# Patient Record
Sex: Female | Born: 1950
Health system: Southern US, Community
[De-identification: ages and names within clinical notes are randomized; demographics above are authoritative.]

## PROBLEM LIST (undated history)

## (undated) DIAGNOSIS — E785 Hyperlipidemia, unspecified: Secondary | ICD-10-CM

## (undated) DIAGNOSIS — E079 Disorder of thyroid, unspecified: Secondary | ICD-10-CM

## (undated) DIAGNOSIS — G43909 Migraine, unspecified, not intractable, without status migrainosus: Secondary | ICD-10-CM

## (undated) DIAGNOSIS — E039 Hypothyroidism, unspecified: Secondary | ICD-10-CM

## (undated) DIAGNOSIS — K219 Gastro-esophageal reflux disease without esophagitis: Secondary | ICD-10-CM

## (undated) HISTORY — PX: SPINAL FUSION: SHX223

## (undated) HISTORY — DX: Disorder of thyroid, unspecified: E07.9

## (undated) HISTORY — PX: ABDOMINAL HYSTERECTOMY: SHX81

## (undated) HISTORY — DX: Migraine, unspecified, not intractable, without status migrainosus: G43.909

## (undated) HISTORY — DX: Hyperlipidemia, unspecified: E78.5

## (undated) HISTORY — PX: FRACTURE SURGERY: SHX138

## (undated) HISTORY — DX: Gastro-esophageal reflux disease without esophagitis: K21.9

---

## 2005-07-07 ENCOUNTER — Ambulatory Visit: Payer: Self-pay | Admitting: Unknown Physician Specialty

## 2005-07-19 ENCOUNTER — Ambulatory Visit: Payer: Self-pay | Admitting: Internal Medicine

## 2005-08-30 ENCOUNTER — Ambulatory Visit: Payer: Self-pay | Admitting: Internal Medicine

## 2005-09-06 ENCOUNTER — Ambulatory Visit: Payer: Self-pay | Admitting: Internal Medicine

## 2006-10-26 ENCOUNTER — Ambulatory Visit: Payer: Self-pay | Admitting: Internal Medicine

## 2007-11-12 ENCOUNTER — Ambulatory Visit: Payer: Self-pay | Admitting: Internal Medicine

## 2009-03-31 ENCOUNTER — Ambulatory Visit: Payer: Self-pay | Admitting: Internal Medicine

## 2010-09-23 ENCOUNTER — Ambulatory Visit: Payer: Self-pay | Admitting: Internal Medicine

## 2011-05-26 ENCOUNTER — Ambulatory Visit: Payer: Self-pay | Admitting: Internal Medicine

## 2011-06-02 ENCOUNTER — Ambulatory Visit: Payer: Self-pay

## 2011-07-28 ENCOUNTER — Ambulatory Visit: Payer: Self-pay | Admitting: Unknown Physician Specialty

## 2011-08-01 LAB — PATHOLOGY REPORT

## 2011-09-29 ENCOUNTER — Ambulatory Visit: Payer: Self-pay | Admitting: Internal Medicine

## 2012-03-07 ENCOUNTER — Ambulatory Visit: Payer: Self-pay | Admitting: Physical Medicine and Rehabilitation

## 2012-07-10 ENCOUNTER — Encounter: Payer: Self-pay | Admitting: Orthopedic Surgery

## 2012-08-06 ENCOUNTER — Encounter: Payer: Self-pay | Admitting: Orthopedic Surgery

## 2012-09-30 ENCOUNTER — Ambulatory Visit: Payer: Self-pay | Admitting: Internal Medicine

## 2013-10-01 ENCOUNTER — Ambulatory Visit: Payer: Self-pay | Admitting: Internal Medicine

## 2014-08-13 ENCOUNTER — Ambulatory Visit: Payer: Self-pay | Admitting: Unknown Physician Specialty

## 2014-08-13 LAB — CLOSTRIDIUM DIFFICILE(ARMC)

## 2014-08-13 LAB — SEDIMENTATION RATE: ERYTHROCYTE SED RATE: 20 mm/h (ref 0–30)

## 2014-08-15 LAB — STOOL CULTURE

## 2014-08-15 LAB — WBCS, STOOL

## 2014-09-03 DIAGNOSIS — K529 Noninfective gastroenteritis and colitis, unspecified: Secondary | ICD-10-CM | POA: Insufficient documentation

## 2014-09-03 DIAGNOSIS — R1013 Epigastric pain: Secondary | ICD-10-CM | POA: Insufficient documentation

## 2014-09-03 DIAGNOSIS — K625 Hemorrhage of anus and rectum: Secondary | ICD-10-CM | POA: Insufficient documentation

## 2014-11-20 ENCOUNTER — Ambulatory Visit: Payer: Self-pay | Admitting: Internal Medicine

## 2015-06-29 ENCOUNTER — Ambulatory Visit (INDEPENDENT_AMBULATORY_CARE_PROVIDER_SITE_OTHER): Payer: 59 | Admitting: Podiatry

## 2015-06-29 ENCOUNTER — Encounter: Payer: Self-pay | Admitting: Podiatry

## 2015-06-29 ENCOUNTER — Ambulatory Visit: Payer: Self-pay

## 2015-06-29 ENCOUNTER — Ambulatory Visit: Payer: 59 | Admitting: Podiatry

## 2015-06-29 VITALS — BP 125/70 | HR 65 | Resp 17

## 2015-06-29 DIAGNOSIS — T148XXA Other injury of unspecified body region, initial encounter: Secondary | ICD-10-CM

## 2015-06-29 DIAGNOSIS — M779 Enthesopathy, unspecified: Secondary | ICD-10-CM | POA: Diagnosis not present

## 2015-06-29 DIAGNOSIS — T148 Other injury of unspecified body region: Secondary | ICD-10-CM

## 2015-06-29 DIAGNOSIS — M79671 Pain in right foot: Secondary | ICD-10-CM

## 2015-07-01 NOTE — Progress Notes (Signed)
Subjective:     Patient ID: Linda Rose, female   DOB: 1951/03/06, 64 y.o.   MRN: [id number]  HPI 64 year old female presents the office today for concerns of painful outside aspect of her right foot which has been ongoing for several months. She appears he had an x-ray performed and June for which she brings with her today. She does not want any further x-rays at this time. She says the pain is mostly outside portion of her foot mostly with walking or standing for prolonged periods of time. She has no pain at rest. She does get some intermittent swelling to outside aspect of her foot as well as an occasional burning pain. She's been the anti-inflammatories which do not seem to help she appears his been on a Medrol Dosepak which did not help either. She does have a history of a tibia fibular fracture in the early 1990s which resulted in deformity and she had an external fixator applied to help correct the deformity. No other complaints this time.  Review of Systems  All other systems reviewed and are negative.      Objective:   Physical Exam AAO x3, NAD DP/PT pulses palpable bilaterally, CRT less than 3 seconds Protective sensation intact with Simms Weinstein monofilament, vibratory sensation intact, Achilles tendon reflex intact There is tenderness to palpation along the lateral aspect of the ankle on the right side over the ATFL and CFL. There is no pain on the PTFL. There is mild discomfort along the course of the peroneal tendons inferior to the lateral malleolus on the insertion into the fifth metatarsal base. There is mild discomfort upon palpation of the sinus tarsi and the lateral aspect of the foot. There is no pain with subtalar joint range of motion. Ankle joint range of motion is intact and without pain. There is no other areas of tenderness to bilateral lower extremities. MMT 5/5, ROM WNL.  No open lesions or pre-ulcerative lesions.  No overlying edema, erythema, increase in warmth to  bilateral lower extremities.  No pain with calf compression, swelling, warmth, erythema bilaterally.      Assessment:     64 year old female lateral right foot pain, likely tendinitis/chronic ankle instability/sinus tarsi syndrome    Plan:     -Previous x-rays were discussed and reviewed the patient. -Treatment options discussed including all alternatives, risks, and complications -Discussed multiple treatment options with the patient. I did discuss steroid injection of the sinus tarsi as is his width majority of her pain is at. I discussed with her sinus tarsi injection for both diagnostic and therapeutic purposes. I discussed risks, occasions for which she understands and verbally consents to. Under sterile conditions a total of 1 mL mixture of dexamethasone phosphate and 2% lidocaine plain was infiltrated into the lateral aspect of the foot over the sinus tarsi into the joint. She tolerated the injection well any complications.  -Dispensed Tri-Lock ankle brace -Anti-inflammatories as needed -Stretching/rehabilitating exercises for peroneal tendons. -Follow-up 2-3 weeks or sooner if any problems arise. In the meantime, encouraged to call the office with any questions, concerns, change in symptoms.   Celesta Gentile, DPM

## 2015-07-20 ENCOUNTER — Ambulatory Visit (INDEPENDENT_AMBULATORY_CARE_PROVIDER_SITE_OTHER): Payer: 59 | Admitting: Podiatry

## 2015-07-20 ENCOUNTER — Encounter: Payer: Self-pay | Admitting: Podiatry

## 2015-07-20 VITALS — BP 127/67 | HR 80 | Resp 18

## 2015-07-20 DIAGNOSIS — M79671 Pain in right foot: Secondary | ICD-10-CM

## 2015-07-20 DIAGNOSIS — M779 Enthesopathy, unspecified: Secondary | ICD-10-CM | POA: Diagnosis not present

## 2015-07-20 MED ORDER — DICLOFENAC SODIUM 1 % TD GEL: 2.0000 g | Freq: Four times a day (QID) | TRANSDERMAL | Status: AC

## 2015-07-20 NOTE — Patient Instructions (Signed)
Peroneal Tendinitis with Rehab Tendonitis is inflammation of a tendon. Inflammation of the tendons on the back of the outer ankle (peroneal tendons) is known as peroneal tendonitis. The peroneal tendons are responsible for connecting the muscles that allow you to stand on your tiptoes to the bones of the ankle. For this reason, peroneal tendonitis often causes pain when trying to complete such motions. Peroneal tendonitis often involves a tear (strain) of the peroneal tendons. Strains are classified into three categories. Grade 1 strains cause pain, but the tendon is not lengthened. Grade 2 strains include a lengthened ligament, due to the ligament being stretched or partially ruptured. With grade 2 strains there is still function, although function may be decreased. Grade 3 strains involve a complete tear of the tendon or muscle, and function is usually impaired. SYMPTOMS   Pain, tenderness, swelling, warmth, or redness over the back of the outer side of the ankle, the outer part of the mid-foot, or the bottom of the arch.  Pain that gets worse with ankle motion (especially when pushing off or pushing down with the front of the foot), or when standing on the ball of the foot or pushing the foot outward.  Crackling sound (crepitation) when the tendon is moved or touched. CAUSES  Peroneal tendinitis occurs when injury to the peroneal tendons causes the body to respond with inflammation. Common causes of injury include:  An overuse injury, in which the groove behind the outer ankle (where the tendon is located) causes wear on the tendon.  A sudden stress placed on the tendon, such as from an increase in the intensity, frequency, or duration of training.  Direct hit (trauma) to the tendon.  Return to activity too soon after a previous ankle injury. RISK INCREASES WITH:  Sports that require sudden, repetitive pushing off of the foot, such as jumping or quick starts.  Kicking and running sports,  especially running down hills or long distances.  Poor strength and flexibility.  Previous injury to the foot, ankle, or leg. PREVENTION  Warm up and stretch properly before activity.  Allow for adequate recovery between workouts.  Maintain physical fitness:  Strength, flexibility, and endurance.  Cardiovascular fitness.  Complete rehabilitation after previous injury. PROGNOSIS  If treated properly, peroneal tendonitis usually heals within 6 weeks.  RELATED COMPLICATIONS  Longer healing time, if not properly treated or if not given enough time to heal.  Recurring symptoms if activity is resumed too soon, with overuse, or when using poor technique.  If untreated, tendinitis may result in tendon rupture, requiring surgery. TREATMENT  Treatment first involves the use of ice and medicine to reduce pain and inflammation. The use of strengthening and stretching exercises may help reduce pain with activity. These exercises may be performed at home or with a therapist. Sometimes, the foot and ankle will be restrained for 10 to 14 days to promote healing. Your caregiver may advise that you place a heel lift in your shoes to reduce the stress placed on the tendon. If nonsurgical treatment is unsuccessful, surgery to remove the inflamed tendon lining (sheath) may be advised.  MEDICATION   If pain medicine is needed, nonsteroidal anti-inflammatory medicines (aspirin and ibuprofen), or other minor pain relievers (acetaminophen), are often advised.  Do not take pain medicine for 7 days before surgery.  Prescription pain relievers may be given, if your caregiver thinks they are needed. Use only as directed and only as much as you need. HEAT AND COLD  Cold treatment (icing) should   be applied for 10 to 15 minutes every 2 to 3 hours for inflammation and pain, and immediately after activity that aggravates your symptoms. Use ice packs or an ice massage.  Heat treatment may be used before  performing stretching and strengthening activities prescribed by your caregiver, physical therapist, or athletic trainer. Use a heat pack or a warm water soak. SEEK MEDICAL CARE IF:  Symptoms get worse or do not improve in 2 to 4 weeks, despite treatment.  New, unexplained symptoms develop. (Drugs used in treatment may produce side effects.) EXERCISES RANGE OF MOTION (ROM) AND STRETCHING EXERCISES - Peroneal Tendinitis These exercises may help you when beginning to rehabilitate your injury. Your symptoms may resolve with or without further involvement from your physician, physical therapist or athletic trainer. While completing these exercises, remember:   Restoring tissue flexibility helps normal motion to return to the joints. This allows healthier, less painful movement and activity.  An effective stretch should be held for at least 30 seconds.  A stretch should never be painful. You should only feel a gentle lengthening or release in the stretched tissue. RANGE OF MOTION - Ankle Eversion  Sit with your right / left ankle crossed over your opposite knee.  Grip your foot with your opposite hand, placing your thumb on the top of your foot and your fingers across the bottom of your foot.  Gently push your foot downward with a slight rotation, so your littlest toes rise slightly toward the ceiling.  You should feel a gentle stretch on the inside of your ankle. Hold the stretch for __________ seconds. Repeat __________ times. Complete this exercise __________ times per day.  RANGE OF MOTION - Ankle Inversion  Sit with your right / left ankle crossed over your opposite knee.  Grip your foot with your opposite hand, placing your thumb on the bottom of your foot and your fingers across the top of your foot.  Gently pull your foot so the smallest toe comes toward you and your thumb pushes the inside of the ball of your foot away from you.  You should feel a gentle stretch on the outside of  your ankle. Hold the stretch for __________ seconds. Repeat __________ times. Complete this exercise __________ times per day.  RANGE OF MOTION - Ankle Plantar Flexion  Sit with your right / left leg crossed over your opposite knee.  Use your opposite hand to pull the top of your foot and toes toward you.  You should feel a gentle stretch on the top of your foot and ankle. Hold this position for __________ seconds. Repeat __________ times. Complete __________ times per day.  STRETCH - Gastroc, Standing  Place your hands on a wall.  Extend your right / left leg behind you, keeping the front knee somewhat bent.  Slightly point your toes inward on your back foot.  Keeping your right / left heel on the floor and your knee straight, shift your weight toward the wall, not allowing your back to arch.  You should feel a gentle stretch in the calf. Hold this position for __________ seconds. Repeat __________ times. Complete this stretch __________ times per day. STRETCH - Soleus, Standing  Place your hands on a wall.  Extend your right / left leg behind you, keeping the other knee somewhat bent.  Slightly point your toes inward on your back foot.  Keep your heel on the floor, bend your back knee, and slightly shift your weight over the back leg so that   you feel a gentle stretch deep in your back calf.  Hold this position for __________ seconds. Repeat __________ times. Complete this stretch __________ times per day. STRETCH - Gastrocsoleus, Standing Note: This exercise can place a lot of stress on your foot and ankle. Please complete this exercise only if specifically instructed by your caregiver.   Place the ball of your right / left foot on a step, keeping your other foot firmly on the same step.  Hold on to the wall or a rail for balance.  Slowly lift your other foot, allowing your body weight to press your heel down over the edge of the step.  You should feel a stretch in your  right / left calf.  Hold this position for __________ seconds.  Repeat this exercise with a slight bend in your knee. Repeat __________ times. Complete this stretch __________ times per day.  STRENGTHENING EXERCISES - Peroneal Tendinitis  These exercises may help you when beginning to rehabilitate your injury. They may resolve your symptoms with or without further involvement from your physician, physical therapist or athletic trainer. While completing these exercises, remember:   Muscles can gain both the endurance and the strength needed for everyday activities through controlled exercises.  Complete these exercises as instructed by your physician, physical therapist or athletic trainer. Increase the resistance and repetitions only as guided by your caregiver. STRENGTH - Dorsiflexors  Secure a rubber exercise band or tubing to a fixed object (table, pole) and loop the other end around your right / left foot.  Sit on the floor facing the fixed object. The band should be slightly tense when your foot is relaxed.  Slowly draw your foot back toward you, using your ankle and toes.  Hold this position for __________ seconds. Slowly release the tension in the band and return your foot to the starting position. Repeat __________ times. Complete this exercise __________ times per day.  STRENGTH - Towel Curls  Sit in a chair, on a non-carpeted surface.  Place your foot on a towel, keeping your heel on the floor.  Pull the towel toward your heel only by curling your toes. Keep your heel on the floor.  If instructed by your physician, physical therapist or athletic trainer, add weight to the end of the towel. Repeat __________ times. Complete this exercise __________ times per day. STRENGTH - Ankle Eversion   Secure one end of a rubber exercise band or tubing to a fixed object (table, pole). Loop the other end around your foot, just before your toes.  Place your fists between your knees.  This will focus your strengthening at your ankle.  Drawing the band across your opposite foot, away from the pole, slowly, pull your little toe out and up. Make sure the band is positioned to resist the entire motion.  Hold this position for __________ seconds.  Have your muscles resist the band, as it slowly pulls your foot back to the starting position. Repeat __________ times. Complete this exercise __________ times per day.  Document Released: 10/23/2005 Document Revised: 03/09/2014 Document Reviewed: 02/04/2009 [employer] Patient Information 2015 [employer]. This information is not intended to replace advice given to you by your health care provider. Make sure you discuss any questions you have with your health care provider.  

## 2015-07-21 NOTE — Progress Notes (Signed)
Patient ID: Linda Rose, female   DOB: [birth year], 64 y.o.   MRN: [id number]  Subjective: 64 year old female presents the office today for follow-up evaluation of bilateral foot pain. She states that she has pain mostly behind the outside aspect of her ankle and has tried to conical related times. She states the more she is on her feet it does hurt. She does not have pain at rest does not wake up at night. She occasionally gets a burning pain in the outside portion of her ankle. She also states the area feels tight. She wears the brace intermittently. She takes anti-inflammatories intermittently. No recent injury or trauma. No swelling or redness. No other complaints at this time.  Objective: AAO 3, NAD Neurovascular status intact and unchanged There is continued tenderness on patient on the course of the peroneal tendon inferior to the lateral malleolus just proximal to the fifth metatarsal base. There is mild pain with eversion. Peroneal tendon appears to be intact. There is mild discomfort along the CFL. No pain on the ATFL or PTFL. Anterior drawer and talar tilt test appear to be negative. There is no pain or crepitation or restriction with ankle, subtalar joint range of motion. There is no overlying edema, erythema, increase in warmth. There is no pain with calf compression, swelling, warmth, erythema. No open lesions or pre-ulcerative lesions.  Assessment: 64 year old female with right peroneal tendinitis  Plan: -Treatment options discussed including all alternatives, risks, and complications -Discussed etiology of symptoms -Rehabilitation exercises for peroneal tendon was discussed. -Voltaren gel prescribed -Ankle brace as needed -Discussed CMO -Follow-up in 4 weeks  or sooner if any problems arise. In the meantime, encouraged to call the office with any questions, concerns, change in symptoms.   Celesta Gentile, DPM

## 2015-08-10 ENCOUNTER — Ambulatory Visit: Payer: 59 | Admitting: Podiatry

## 2015-11-03 ENCOUNTER — Other Ambulatory Visit: Payer: Self-pay | Admitting: Internal Medicine

## 2015-11-03 DIAGNOSIS — Z1231 Encounter for screening mammogram for malignant neoplasm of breast: Secondary | ICD-10-CM

## 2015-11-26 ENCOUNTER — Ambulatory Visit
Admission: RE | Admit: 2015-11-26 | Discharge: 2015-11-26 | Disposition: A | Discharge status: Home / Self Care | Payer: 59 | Source: Ambulatory Visit | Attending: Internal Medicine | Admitting: Internal Medicine

## 2015-11-26 DIAGNOSIS — Z1231 Encounter for screening mammogram for malignant neoplasm of breast: Secondary | ICD-10-CM | POA: Insufficient documentation

## 2016-03-03 ENCOUNTER — Other Ambulatory Visit: Payer: Self-pay | Admitting: Orthopedic Surgery

## 2016-03-03 DIAGNOSIS — M25562 Pain in left knee: Secondary | ICD-10-CM

## 2016-03-21 ENCOUNTER — Ambulatory Visit: Payer: 59

## 2016-05-26 ENCOUNTER — Encounter: Payer: Self-pay | Admitting: Obstetrics and Gynecology

## 2016-09-19 ENCOUNTER — Encounter: Payer: Self-pay | Admitting: Obstetrics and Gynecology

## 2016-09-19 ENCOUNTER — Ambulatory Visit (INDEPENDENT_AMBULATORY_CARE_PROVIDER_SITE_OTHER): Payer: 59 | Admitting: Obstetrics and Gynecology

## 2016-09-19 VITALS — BP 172/69 | HR 76 | Ht 60.5 in | Wt 141.9 lb

## 2016-09-19 DIAGNOSIS — N952 Postmenopausal atrophic vaginitis: Secondary | ICD-10-CM | POA: Diagnosis not present

## 2016-09-19 DIAGNOSIS — R350 Frequency of micturition: Secondary | ICD-10-CM

## 2016-09-19 NOTE — Progress Notes (Signed)
Subjective:    Linda Rose is a 65 y.o. female who complains of frequency for several months.  Patient also complains of soreness on outside of vagina when wiping. She denies back pain, fever and vaginal discharge.  Patient does not have a history of recurrent UTI.  Patient does not have a history of pyelonephritis.  Had recent UA done with PCP, was normal, was still given Bactrim for possible UTI. Notes it helped some but still noting frequency. Reports minimal caffiene intake (1 cup of coffee/tea daily and occasional soda intake)   Past Medical History:  Diagnosis Date  . GERD (gastroesophageal reflux disease)   . Hyperlipidemia   . Migraines   . Thyroid disease     Family History  Problem Relation Age of Onset  . Diabetes Mother   . Hypertension Mother   . Heart failure Mother   . Thyroid disease Father     Past Surgical History:  Procedure Laterality Date  . ABDOMINAL HYSTERECTOMY    . FRACTURE SURGERY     Tibulala and Fibula   . SPINAL FUSION      Social History   Social History  . Marital status: Single    Spouse name: N/A  . Number of children: N/A  . Years of education: N/A   Occupational History  . Not on file.   Social History Main Topics  . Smoking status: Never Smoker  . Smokeless tobacco: Never Used  . Alcohol use No  . Drug use: No  . Sexual activity: Not Currently    Birth control/ protection: Surgical   Other Topics Concern  . Not on file   Social History Narrative  . No narrative on file    Current Outpatient Prescriptions on File Prior to Visit  Medication Sig Dispense Refill  . ALPRAZolam (XANAX) 0.25 MG tablet TAKE ONE TABLET TWICE DAILY AS NEEDED    . aspirin EC 81 MG tablet Take by mouth.    . Calcium Carbonate-Vitamin D 600-400 MG-UNIT per tablet Take by mouth.    . diclofenac sodium (VOLTAREN) 1 % GEL Apply 2 g topically 4 (four) times daily. Rub into affected area of foot 2 to 4 times daily 100 g 2  . estradiol (ESTRACE) 0.5  MG tablet Take 0.5 mg by mouth.    . Multiple Vitamin (MULTI-VITAMINS) TABS Take by mouth.    . Multiple Vitamins-Minerals ([place] 50+) CAPS Take by mouth.    Marland Kitchen omeprazole (PRILOSEC) 40 MG capsule Take 40 mg by mouth.    . SUMAtriptan (IMITREX) 100 MG tablet Take 100 mg by mouth.     No current facility-administered medications on file prior to visit.     Allergies  Allergen Reactions  . Metrizamide Rash and Swelling  . Iodinated Diagnostic Agents Rash and Swelling    Review of Systems Pertinent items noted in HPI and remainder of comprehensive ROS otherwise negative.    Objective:    BP (!) 172/69 (BP Location: Left Arm, Patient Position: Sitting, Cuff Size: Normal)   Pulse 76   Ht 5' 0.5" (1.537 m)   Wt 141 lb 14.4 oz (64.4 kg)   LMP  (LMP Unknown)   BMI 27.26 kg/m  General: alert and no distress  Abdomen: soft, non-tender, without masses or organomegaly   Back: CVA tenderness absent  Pelvis:  Female genitalia: Bladder no bladder distension noted Urethra: normal appearing urethra with no masses, tenderness or lesions Vulva: normal appearing vulva with no masses, tenderness or  lesions Vagina: mildly atrophic and without discharge, lesions, or tenderness.  Cervix: normal appearing cervix without discharge or lesions Uterus: uterus is normal size, shape, consistency and nontender Adnexa: normal adnexa in size, nontender and no masses Rectal: not indicated   Extremities: extremities normal, atraumatic, no cyanosis or edema  Neurological: Neurologic: Grossly normal     Assessment:   Urinary frequency Mild vaginal atrophy    Plan: Plan:    1. Discussion had on urinary frequency, could be intrinsic to the bladder, such as OAB symptoms.  Has recently had UTI ruled out, and was still treated prophylactically for a UTI. Discussed lifestyle modifications, including eliminating caffiene intake, avoiding bladder irritants, limiting fluid intake.  Also discussed use of  OAB medications.  Patient desires to try lifestyle modifications first.  2. Mlid vaginal atrophy present. Patient may benefit from local estrogen therapy.  Will offer if lifestyle modifications ineffective.  3. To f/u symptoms in 3-4 weeks.    Rubie Maid, MD [facility]

## 2016-10-11 ENCOUNTER — Encounter: Payer: 59 | Admitting: Obstetrics and Gynecology

## 2016-12-12 ENCOUNTER — Other Ambulatory Visit: Payer: Self-pay | Admitting: Internal Medicine

## 2016-12-12 DIAGNOSIS — Z1231 Encounter for screening mammogram for malignant neoplasm of breast: Secondary | ICD-10-CM

## 2017-01-05 ENCOUNTER — Ambulatory Visit
Admission: RE | Admit: 2017-01-05 | Discharge: 2017-01-05 | Disposition: A | Discharge status: Home / Self Care | Payer: 59 | Source: Ambulatory Visit | Attending: Internal Medicine | Admitting: Internal Medicine

## 2017-01-05 DIAGNOSIS — Z1231 Encounter for screening mammogram for malignant neoplasm of breast: Secondary | ICD-10-CM | POA: Diagnosis not present

## 2017-01-26 ENCOUNTER — Other Ambulatory Visit (INDEPENDENT_AMBULATORY_CARE_PROVIDER_SITE_OTHER): Payer: Self-pay | Admitting: Vascular Surgery

## 2017-01-26 ENCOUNTER — Ambulatory Visit (INDEPENDENT_AMBULATORY_CARE_PROVIDER_SITE_OTHER): Payer: 59

## 2017-01-26 ENCOUNTER — Ambulatory Visit (INDEPENDENT_AMBULATORY_CARE_PROVIDER_SITE_OTHER): Payer: 59 | Admitting: Vascular Surgery

## 2017-01-26 DIAGNOSIS — I6523 Occlusion and stenosis of bilateral carotid arteries: Secondary | ICD-10-CM | POA: Diagnosis not present

## 2017-01-26 DIAGNOSIS — E785 Hyperlipidemia, unspecified: Secondary | ICD-10-CM

## 2017-01-26 NOTE — Progress Notes (Signed)
Subjective:    Patient ID: Linda Rose, female    DOB: January 10, 1951, 66 y.o.   MRN: [id number] Chief Complaint  Patient presents with  . Carotid    Ultrasound follow up   Patient presents for a two year non-invasive study follow up for carotid stenosis. The stenosis has been followed by surveillance duplexes. The patient underwent a bilateral carotid duplex scan which showed no change from the previous exam on 07/02/15. Duplex is stable at 1-39% ICA stenosis bilaterally. The patient denies experiencing Amaurosis Fugax, TIA like symptoms or focal motor deficits.    Review of Systems  Constitutional: Negative.   HENT: Negative.   Eyes: Negative.   Respiratory: Negative.   Gastrointestinal: Negative.   Endocrine: Negative.   Genitourinary: Negative.   Musculoskeletal: Negative.   Skin: Negative.   Allergic/Immunologic: Negative.   Neurological: Negative.   Hematological: Negative.   Psychiatric/Behavioral: Negative.       Objective:   Physical Exam  Constitutional: She is oriented to person, place, and time. She appears well-developed and well-nourished.  HENT:  Head: Normocephalic and atraumatic.  Right Ear: External ear normal.  Left Ear: External ear normal.  Eyes: Conjunctivae and EOM are normal. Pupils are equal, round, and reactive to light.  Neck: Normal range of motion.  No carotid bruits appreciated.  Cardiovascular: Normal rate, regular rhythm, normal heart sounds and intact distal pulses.   Pulses:      Femoral pulses are 2+ on the right side, and 2+ on the left side. Pulmonary/Chest: Effort normal.  Musculoskeletal: Normal range of motion. She exhibits no edema.  Neurological: She is alert and oriented to person, place, and time.  Skin: Skin is warm and dry.  Psychiatric: She has a normal mood and affect. Her behavior is normal. Judgment and thought content normal.   LMP  (LMP Unknown)   Past Medical History:  Diagnosis Date  . GERD (gastroesophageal reflux  disease)   . Hyperlipidemia   . Migraines   . Thyroid disease    Social History   Social History  . Marital status: Single    Spouse name: N/A  . Number of children: N/A  . Years of education: N/A   Occupational History  . Not on file.   Social History Main Topics  . Smoking status: Never Smoker  . Smokeless tobacco: Never Used  . Alcohol use No  . Drug use: No  . Sexual activity: Not Currently    Birth control/ protection: Surgical   Other Topics Concern  . Not on file   Social History Narrative  . No narrative on file   Past Surgical History:  Procedure Laterality Date  . ABDOMINAL HYSTERECTOMY    . FRACTURE SURGERY     Tibulala and Fibula   . SPINAL FUSION     Family History  Problem Relation Age of Onset  . Diabetes Mother   . Hypertension Mother   . Heart failure Mother   . Thyroid disease Father    Allergies  Allergen Reactions  . Metrizamide Rash and Swelling  . Iodinated Diagnostic Agents Rash and Swelling      Assessment & Plan:  Patient presents for a two year non-invasive study follow up for carotid stenosis. The stenosis has been followed by surveillance duplexes. The patient underwent a bilateral carotid duplex scan which showed no change from the previous exam on 07/02/15. Duplex is stable at 1-39% ICA stenosis bilaterally. The patient denies experiencing Amaurosis Fugax, TIA like symptoms or  focal motor deficits.   1. Bilateral carotid artery stenosis - Stable Studies reviewed with patient. Patient asymptomatic with stable duplex.  No intervention at this time.  Patient to return in one month for surveillance carotid duplex. Patient to remain abstinent of tobacco use. I have discussed with the patient at length the risk factors for and pathogenesis of atherosclerotic disease and encouraged a healthy diet, regular exercise regimen and blood pressure / glucose control.  Patient was instructed to contact our office in the interim with problems  such as arm / leg weakness or numbness, speech / swallowing difficulty or temporary monocular blindness. The patient expresses their understanding.   - VAS US CAROTID; Future  2. Hyperlipidemia, unspecified hyperlipidemia type - Stable Encouraged good control as its slows the progression of atherosclerotic disease.  Current Outpatient Prescriptions on File Prior to Visit  Medication Sig Dispense Refill  . ALPRAZolam (XANAX) 0.25 MG tablet TAKE ONE TABLET TWICE DAILY AS NEEDED    . aspirin EC 81 MG tablet Take by mouth.    Marland Kitchen atorvastatin (LIPITOR) 10 MG tablet TAKE ONE TABLET NIGHTLY    . Calcium Carbonate-Vitamin D 600-400 MG-UNIT per tablet Take by mouth.    . diclofenac sodium (VOLTAREN) 1 % GEL Apply 2 g topically 4 (four) times daily. Rub into affected area of foot 2 to 4 times daily 100 g 2  . estradiol (ESTRACE) 0.5 MG tablet Take 0.5 mg by mouth.    Marland Kitchen ibuprofen (ADVIL,MOTRIN) 200 MG tablet Take by mouth.    . levothyroxine (SYNTHROID, LEVOTHROID) 112 MCG tablet Take by mouth.    . meloxicam (MOBIC) 15 MG tablet Take 15 mg by mouth.    . Multiple Vitamin (MULTI-VITAMINS) TABS Take by mouth.    . Multiple Vitamins-Minerals (Kay 50+) CAPS Take by mouth.    Marland Kitchen omeprazole (PRILOSEC) 40 MG capsule Take 40 mg by mouth.    . SUMAtriptan (IMITREX) 100 MG tablet Take 100 mg by mouth.     No current facility-administered medications on file prior to visit.     There are no Patient Instructions on file for this visit. No Follow-up on file.   Akansha Wyche A Antonious Omahoney, PA-C

## 2017-10-06 HISTORY — PX: TRIGGER FINGER RELEASE: SHX641

## 2018-01-28 ENCOUNTER — Other Ambulatory Visit: Payer: Self-pay | Admitting: Internal Medicine

## 2018-01-28 DIAGNOSIS — Z1231 Encounter for screening mammogram for malignant neoplasm of breast: Secondary | ICD-10-CM

## 2018-02-15 ENCOUNTER — Ambulatory Visit
Admission: RE | Admit: 2018-02-15 | Discharge: 2018-02-15 | Disposition: A | Discharge status: Home / Self Care | Payer: 59 | Source: Ambulatory Visit | Attending: Internal Medicine | Admitting: Internal Medicine

## 2018-02-15 DIAGNOSIS — Z1231 Encounter for screening mammogram for malignant neoplasm of breast: Secondary | ICD-10-CM | POA: Diagnosis present

## 2019-01-09 NOTE — Patient Instructions (Addendum)
Linda Rose  01/09/2019   Your procedure is scheduled on: 01-20-19    Report to [facility] Main  Entrance    Report to Short Stay at 5:30 AM    Call this number if you have problems the morning of surgery [phone]    Remember: Do not eat food or drink liquids :After Midnight.    BRUSH YOUR TEETH MORNING OF SURGERY AND RINSE YOUR MOUTH OUT, NO CHEWING GUM CANDY OR MINTS.     Take these medicines the morning of surgery with A SIP OF WATER: Cetirizine (Zyrtec), Levothroid (Synthroid), Omeprazole (Prilosec), and Sumatriptan (Imitrex), prn. You may also use and bring your nasal spray as needed.                                You may not have any metal on your body including hair pins and              piercings  Do not wear jewelry, make-up, lotions, powders or perfumes, deodorant             Do not wear nail polish.  Do not shave  48 hours prior to surgery.               Do not bring valuables to the hospital. [place].  Contacts, dentures or bridgework may not be worn into surgery.  Leave suitcase in the car. After surgery it may be brought to your room.     Patients discharged the day of surgery will not be allowed to drive home. IF YOU ARE HAVING SURGERY AND GOING HOME THE SAME DAY, YOU MUST HAVE AN ADULT TO DRIVE YOU HOME AND BE WITH YOU FOR 24 HOURS. YOU MAY GO HOME BY TAXI OR UBER OR ORTHERWISE, BUT AN ADULT MUST ACCOMPANY YOU HOME AND STAY WITH YOU FOR 24 HOURS.    Special Instructions: N/A              Please read over the following fact sheets you were given: _____________________________________________________________________             [facility] - Preparing for Surgery Before surgery, you can play an important role.  Because skin is not sterile, your skin needs to be as free of germs as possible.  You can reduce the number of germs on your skin by washing with CHG (chlorahexidine  gluconate) soap before surgery.  CHG is an antiseptic cleaner which kills germs and bonds with the skin to continue killing germs even after washing. Please DO NOT use if you have an allergy to CHG or antibacterial soaps.  If your skin becomes reddened/irritated stop using the CHG and inform your nurse when you arrive at Short Stay. Do not shave (including legs and underarms) for at least 48 hours prior to the first CHG shower.  You may shave your face/neck. Please follow these instructions carefully:  1.  Shower with CHG Soap the night before surgery and the  morning of Surgery.  2.  If you choose to wash your hair, wash your hair first as usual with your  normal  shampoo.  3.  After you shampoo, rinse your hair and body thoroughly to remove the  shampoo.  4.  Use CHG as you would any other liquid soap.  You can apply chg directly  to the skin and wash                       Gently with a scrungie or clean washcloth.  5.  Apply the CHG Soap to your body ONLY FROM THE NECK DOWN.   Do not use on face/ open                           Wound or open sores. Avoid contact with eyes, ears mouth and genitals (private parts).                       Wash face,  Genitals (private parts) with your normal soap.             6.  Wash thoroughly, paying special attention to the area where your surgery  will be performed.  7.  Thoroughly rinse your body with warm water from the neck down.  8.  DO NOT shower/wash with your normal soap after using and rinsing off  the CHG Soap.                9.  Pat yourself dry with a clean towel.            10.  Wear clean pajamas.            11.  Place clean sheets on your bed the night of your first shower and do not  sleep with pets. Day of Surgery : Do not apply any lotions/deodorants the morning of surgery.  Please wear clean clothes to the hospital/surgery center.  FAILURE TO FOLLOW THESE INSTRUCTIONS MAY RESULT IN THE CANCELLATION OF YOUR  SURGERY PATIENT SIGNATURE_________________________________  NURSE SIGNATURE__________________________________  ________________________________________________________________________

## 2019-01-09 NOTE — Progress Notes (Signed)
Please provide surgery orders. Pt is scheduled for her PAT appt on 01-10-19.

## 2019-01-10 ENCOUNTER — Encounter (HOSPITAL_COMMUNITY): Payer: Self-pay

## 2019-01-10 ENCOUNTER — Other Ambulatory Visit: Payer: Self-pay

## 2019-01-10 ENCOUNTER — Encounter (HOSPITAL_COMMUNITY)
Admission: RE | Admit: 2019-01-10 | Discharge: 2019-01-10 | Disposition: A | Discharge status: Home / Self Care | Payer: Medicare Other | Source: Ambulatory Visit | Attending: Orthopedic Surgery | Admitting: Orthopedic Surgery

## 2019-01-10 DIAGNOSIS — Z01812 Encounter for preprocedural laboratory examination: Secondary | ICD-10-CM | POA: Diagnosis not present

## 2019-01-10 LAB — CBC
HCT: 43.4 % (ref 36.0–46.0)
Hemoglobin: 14.1 g/dL (ref 12.0–15.0)
MCH: 31.8 pg (ref 26.0–34.0)
MCHC: 32.5 g/dL (ref 30.0–36.0)
MCV: 97.7 fL (ref 80.0–100.0)
Platelets: 245 10*3/uL (ref 150–400)
RBC: 4.44 MIL/uL (ref 3.87–5.11)
RDW: 12.3 % (ref 11.5–15.5)
WBC: 5.8 10*3/uL (ref 4.0–10.5)
nRBC: 0 % (ref 0.0–0.2)

## 2019-01-10 LAB — BASIC METABOLIC PANEL
Anion gap: 6 (ref 5–15)
BUN: 17 mg/dL (ref 8–23)
CO2: 28 mmol/L (ref 22–32)
Calcium: 9.8 mg/dL (ref 8.9–10.3)
Chloride: 105 mmol/L (ref 98–111)
Creatinine, Ser: 0.65 mg/dL (ref 0.44–1.00)
GFR calc Af Amer: >60 mL/min (ref >60)
GFR calc non Af Amer: >60 mL/min (ref >60)
Glucose, Bld: 112 mg/dL — ABNORMAL HIGH (ref 70–99)
Potassium: 4.9 mmol/L (ref 3.5–5.1)
Sodium: 139 mmol/L (ref 135–145)

## 2019-01-10 LAB — SURGICAL PCR SCREEN
MRSA, PCR: NEGATIVE
STAPHYLOCOCCUS AUREUS: NEGATIVE

## 2019-01-17 NOTE — Progress Notes (Signed)
Called Carlyon Shadow PA requesting orders for 01/20/2019 surgery.

## 2019-01-19 ENCOUNTER — Other Ambulatory Visit: Payer: Self-pay | Admitting: Orthopedic Surgery

## 2019-01-19 NOTE — Anesthesia Preprocedure Evaluation (Addendum)
Anesthesia Evaluation  Patient identified by MRN, date of birth, ID band Patient awake    Reviewed: Allergy & Precautions, NPO status , Patient's Chart, lab work & pertinent test results  Airway Mallampati: II  TM Distance: >3 FB Neck ROM: Full    Dental  (+) Dental Advisory Given, Teeth Intact   Pulmonary former smoker,    Pulmonary exam normal breath sounds clear to auscultation       Cardiovascular negative cardio ROS Normal cardiovascular exam Rhythm:Regular Rate:Normal     Neuro/Psych  Headaches,    GI/Hepatic Neg liver ROS, GERD  ,  Endo/Other  negative endocrine ROS  Renal/GU negative Renal ROS     Musculoskeletal negative musculoskeletal ROS (+)   Abdominal   Peds  Hematology negative hematology ROS (+)   Anesthesia Other Findings Day of surgery medications reviewed with the patient.  Reproductive/Obstetrics                            Anesthesia Physical Anesthesia Plan  ASA: II  Anesthesia Plan: Spinal   Post-op Pain Management:  Regional for Post-op pain   Induction:   PONV Risk Score and Plan: 3 and Ondansetron, Dexamethasone, Propofol infusion and Treatment may vary due to age or medical condition  Airway Management Planned: Natural Airway  Additional Equipment: None  Intra-op Plan:   Post-operative Plan:   Informed Consent: I have reviewed the patients History and Physical, chart, labs and discussed the procedure including the risks, benefits and alternatives for the proposed anesthesia with the patient or authorized representative who has indicated his/her understanding and acceptance.     Dental advisory given  Plan Discussed with: CRNA  Anesthesia Plan Comments:         Anesthesia Quick Evaluation

## 2019-01-20 ENCOUNTER — Other Ambulatory Visit: Payer: Self-pay

## 2019-01-20 ENCOUNTER — Encounter (HOSPITAL_COMMUNITY)
Admission: RE | Disposition: A | Discharge status: Home / Self Care | Payer: Self-pay | Source: Home / Self Care | Attending: Orthopedic Surgery

## 2019-01-20 ENCOUNTER — Ambulatory Visit (HOSPITAL_COMMUNITY): Payer: Medicare Other | Admitting: Anesthesiology

## 2019-01-20 ENCOUNTER — Encounter (HOSPITAL_COMMUNITY): Payer: Self-pay | Admitting: *Deleted

## 2019-01-20 ENCOUNTER — Observation Stay (HOSPITAL_COMMUNITY)
Admission: RE | Admit: 2019-01-20 | Discharge: 2019-01-21 | Disposition: A | Discharge status: Home / Self Care | Payer: Medicare Other | Attending: Orthopedic Surgery | Admitting: Orthopedic Surgery

## 2019-01-20 ENCOUNTER — Ambulatory Visit: Admit: 2019-01-20 | Payer: 59 | Admitting: Orthopedic Surgery

## 2019-01-20 ENCOUNTER — Ambulatory Visit (HOSPITAL_COMMUNITY): Payer: Medicare Other | Admitting: Physician Assistant

## 2019-01-20 DIAGNOSIS — E079 Disorder of thyroid, unspecified: Secondary | ICD-10-CM | POA: Diagnosis not present

## 2019-01-20 DIAGNOSIS — Z888 Allergy status to other drugs, medicaments and biological substances status: Secondary | ICD-10-CM | POA: Diagnosis not present

## 2019-01-20 DIAGNOSIS — M1711 Unilateral primary osteoarthritis, right knee: Secondary | ICD-10-CM | POA: Diagnosis not present

## 2019-01-20 DIAGNOSIS — R2681 Unsteadiness on feet: Secondary | ICD-10-CM | POA: Diagnosis not present

## 2019-01-20 DIAGNOSIS — Z7982 Long term (current) use of aspirin: Secondary | ICD-10-CM | POA: Diagnosis not present

## 2019-01-20 DIAGNOSIS — E785 Hyperlipidemia, unspecified: Secondary | ICD-10-CM | POA: Diagnosis not present

## 2019-01-20 DIAGNOSIS — R2689 Other abnormalities of gait and mobility: Secondary | ICD-10-CM | POA: Insufficient documentation

## 2019-01-20 DIAGNOSIS — Z87891 Personal history of nicotine dependence: Secondary | ICD-10-CM | POA: Diagnosis not present

## 2019-01-20 DIAGNOSIS — K219 Gastro-esophageal reflux disease without esophagitis: Secondary | ICD-10-CM | POA: Diagnosis not present

## 2019-01-20 DIAGNOSIS — Z79899 Other long term (current) drug therapy: Secondary | ICD-10-CM | POA: Diagnosis not present

## 2019-01-20 DIAGNOSIS — Z79890 Hormone replacement therapy (postmenopausal): Secondary | ICD-10-CM | POA: Insufficient documentation

## 2019-01-20 DIAGNOSIS — Z96651 Presence of right artificial knee joint: Principal | ICD-10-CM | POA: Insufficient documentation

## 2019-01-20 DIAGNOSIS — Z791 Long term (current) use of non-steroidal anti-inflammatories (NSAID): Secondary | ICD-10-CM | POA: Insufficient documentation

## 2019-01-20 DIAGNOSIS — Z96659 Presence of unspecified artificial knee joint: Secondary | ICD-10-CM

## 2019-01-20 HISTORY — PX: TOTAL KNEE ARTHROPLASTY: SHX125

## 2019-01-20 SURGERY — ARTHROPLASTY, KNEE, TOTAL
Anesthesia: Spinal | Laterality: Right

## 2019-01-20 SURGERY — ARTHROPLASTY, KNEE, TOTAL
Anesthesia: Spinal | Site: Knee | Laterality: Right

## 2019-01-20 MED ORDER — PHENYLEPHRINE 40 MCG/ML (10ML) SYRINGE FOR IV PUSH (FOR BLOOD PRESSURE SUPPORT)
PREFILLED_SYRINGE | INTRAVENOUS | Status: DC | PRN
  Administered 2019-01-20: 80 ug via INTRAVENOUS
  Administered 2019-01-20: 40 ug via INTRAVENOUS
  Administered 2019-01-20: 80 ug via INTRAVENOUS

## 2019-01-20 MED ORDER — BUPIVACAINE HCL (PF) 0.5 % IJ SOLN
INTRAMUSCULAR | Status: DC | PRN
  Administered 2019-01-20: 20 mL

## 2019-01-20 MED ORDER — ONDANSETRON HCL 4 MG/2ML IJ SOLN: 4.0000 mg | Freq: Four times a day (QID) | INTRAMUSCULAR | Status: DC | PRN

## 2019-01-20 MED ORDER — BUPIVACAINE LIPOSOME 1.3 % IJ SUSP
INTRAMUSCULAR | Status: DC | PRN
  Administered 2019-01-20: 20 mL

## 2019-01-20 MED ORDER — METOCLOPRAMIDE HCL 5 MG/ML IJ SOLN: 5.0000 mg | Freq: Three times a day (TID) | INTRAMUSCULAR | Status: DC | PRN

## 2019-01-20 MED ORDER — CHLORHEXIDINE GLUCONATE 4 % EX LIQD: 60.0000 mL | Freq: Once | CUTANEOUS | Status: DC

## 2019-01-20 MED ORDER — DIPHENHYDRAMINE HCL 12.5 MG/5ML PO ELIX: 12.5000 mg | ORAL_SOLUTION | ORAL | Status: DC | PRN

## 2019-01-20 MED ORDER — MIDAZOLAM HCL 5 MG/5ML IJ SOLN
INTRAMUSCULAR | Status: DC | PRN
  Administered 2019-01-20: 2 mg via INTRAVENOUS

## 2019-01-20 MED ORDER — LORATADINE 10 MG PO TABS
10.0000 mg | ORAL_TABLET | Freq: Every day | ORAL | Status: DC
  Administered 2019-01-20 – 2019-01-21 (×2): 10 mg via ORAL
  Filled 2019-01-20 (×2): qty 1

## 2019-01-20 MED ORDER — FENTANYL CITRATE (PF) 100 MCG/2ML IJ SOLN
INTRAMUSCULAR | Status: DC | PRN
  Administered 2019-01-20 (×2): 50 ug via INTRAVENOUS

## 2019-01-20 MED ORDER — ACETAMINOPHEN 500 MG PO TABS
1000.0000 mg | ORAL_TABLET | Freq: Four times a day (QID) | ORAL | Status: DC
  Administered 2019-01-20 – 2019-01-21 (×3): 1000 mg via ORAL
  Filled 2019-01-20 (×3): qty 2

## 2019-01-20 MED ORDER — METOCLOPRAMIDE HCL 5 MG PO TABS: 5.0000 mg | ORAL_TABLET | Freq: Three times a day (TID) | ORAL | Status: DC | PRN

## 2019-01-20 MED ORDER — METHOCARBAMOL 500 MG PO TABS
500.0000 mg | ORAL_TABLET | Freq: Four times a day (QID) | ORAL | Status: DC | PRN
  Administered 2019-01-21: 500 mg via ORAL
  Filled 2019-01-20: qty 1

## 2019-01-20 MED ORDER — FENTANYL CITRATE (PF) 100 MCG/2ML IJ SOLN
INTRAMUSCULAR | Status: AC
  Filled 2019-01-20: qty 2

## 2019-01-20 MED ORDER — SENNOSIDES-DOCUSATE SODIUM 8.6-50 MG PO TABS: 1.0000 | ORAL_TABLET | Freq: Every evening | ORAL | Status: DC | PRN

## 2019-01-20 MED ORDER — TRANEXAMIC ACID-NACL 1000-0.7 MG/100ML-% IV SOLN
1000.0000 mg | Freq: Once | INTRAVENOUS | Status: AC
  Administered 2019-01-20: 1000 mg via INTRAVENOUS
  Filled 2019-01-20: qty 100

## 2019-01-20 MED ORDER — SODIUM CHLORIDE 0.9% FLUSH
INTRAVENOUS | Status: DC | PRN
  Administered 2019-01-20: 20 mL

## 2019-01-20 MED ORDER — HYDROMORPHONE HCL 1 MG/ML IJ SOLN: 0.2500 mg | INTRAMUSCULAR | Status: DC | PRN

## 2019-01-20 MED ORDER — GABAPENTIN 300 MG PO CAPS
300.0000 mg | ORAL_CAPSULE | Freq: Three times a day (TID) | ORAL | Status: DC
  Administered 2019-01-20 – 2019-01-21 (×3): 300 mg via ORAL
  Filled 2019-01-20 (×3): qty 1

## 2019-01-20 MED ORDER — DEXAMETHASONE SODIUM PHOSPHATE 10 MG/ML IJ SOLN
INTRAMUSCULAR | Status: AC
  Filled 2019-01-20: qty 1

## 2019-01-20 MED ORDER — BUPIVACAINE-EPINEPHRINE 0.25% -1:200000 IJ SOLN
INTRAMUSCULAR | Status: DC | PRN
  Administered 2019-01-20: 30 mL

## 2019-01-20 MED ORDER — DEXAMETHASONE SODIUM PHOSPHATE 10 MG/ML IJ SOLN
10.0000 mg | Freq: Once | INTRAMUSCULAR | Status: AC
  Administered 2019-01-21: 10 mg via INTRAVENOUS
  Filled 2019-01-20: qty 1

## 2019-01-20 MED ORDER — BISACODYL 5 MG PO TBEC: 5.0000 mg | DELAYED_RELEASE_TABLET | Freq: Every day | ORAL | Status: DC | PRN

## 2019-01-20 MED ORDER — ASPIRIN EC 325 MG PO TBEC
325.0000 mg | DELAYED_RELEASE_TABLET | Freq: Two times a day (BID) | ORAL | Status: DC
  Administered 2019-01-21: 325 mg via ORAL
  Filled 2019-01-20: qty 1

## 2019-01-20 MED ORDER — ACETAMINOPHEN 500 MG PO TABS
1000.0000 mg | ORAL_TABLET | Freq: Once | ORAL | Status: AC
  Administered 2019-01-20: 1000 mg via ORAL

## 2019-01-20 MED ORDER — FERROUS SULFATE 325 (65 FE) MG PO TABS
325.0000 mg | ORAL_TABLET | Freq: Three times a day (TID) | ORAL | Status: DC
  Administered 2019-01-20 – 2019-01-21 (×2): 325 mg via ORAL
  Filled 2019-01-20 (×2): qty 1

## 2019-01-20 MED ORDER — CEFAZOLIN SODIUM-DEXTROSE 2-4 GM/100ML-% IV SOLN
INTRAVENOUS | Status: AC
  Filled 2019-01-20: qty 100

## 2019-01-20 MED ORDER — EPHEDRINE 5 MG/ML INJ
INTRAVENOUS | Status: AC
  Filled 2019-01-20: qty 10

## 2019-01-20 MED ORDER — SODIUM CHLORIDE (PF) 0.9 % IJ SOLN
INTRAMUSCULAR | Status: AC
  Filled 2019-01-20: qty 20

## 2019-01-20 MED ORDER — OXYCODONE HCL 5 MG PO TABS
5.0000 mg | ORAL_TABLET | ORAL | Status: DC | PRN
  Administered 2019-01-20 (×2): 10 mg via ORAL
  Administered 2019-01-20: 5 mg via ORAL
  Administered 2019-01-21 (×2): 10 mg via ORAL
  Filled 2019-01-20 (×4): qty 2
  Filled 2019-01-20: qty 1

## 2019-01-20 MED ORDER — PROMETHAZINE HCL 25 MG/ML IJ SOLN: 6.2500 mg | INTRAMUSCULAR | Status: DC | PRN

## 2019-01-20 MED ORDER — CEFAZOLIN SODIUM-DEXTROSE 2-4 GM/100ML-% IV SOLN
2.0000 g | INTRAVENOUS | Status: AC
  Administered 2019-01-20: 2 g via INTRAVENOUS

## 2019-01-20 MED ORDER — PANTOPRAZOLE SODIUM 40 MG PO TBEC: 40.0000 mg | DELAYED_RELEASE_TABLET | Freq: Every day | ORAL | Status: DC

## 2019-01-20 MED ORDER — MEPERIDINE HCL 50 MG/ML IJ SOLN: 6.2500 mg | INTRAMUSCULAR | Status: DC | PRN

## 2019-01-20 MED ORDER — GABAPENTIN 300 MG PO CAPS
ORAL_CAPSULE | ORAL | Status: AC
  Administered 2019-01-20: 300 mg via ORAL
  Filled 2019-01-20: qty 1

## 2019-01-20 MED ORDER — GABAPENTIN 300 MG PO CAPS
300.0000 mg | ORAL_CAPSULE | Freq: Once | ORAL | Status: AC
  Administered 2019-01-20: 300 mg via ORAL

## 2019-01-20 MED ORDER — MIDAZOLAM HCL 2 MG/2ML IJ SOLN
INTRAMUSCULAR | Status: AC
  Filled 2019-01-20: qty 2

## 2019-01-20 MED ORDER — BUPIVACAINE LIPOSOME 1.3 % IJ SUSP
20.0000 mL | Freq: Once | INTRAMUSCULAR | Status: DC
  Filled 2019-01-20: qty 20

## 2019-01-20 MED ORDER — BUPIVACAINE IN DEXTROSE 0.75-8.25 % IT SOLN
INTRATHECAL | Status: DC | PRN
  Administered 2019-01-20: 1.6 mL via INTRATHECAL

## 2019-01-20 MED ORDER — ESTRADIOL 1 MG PO TABS
0.5000 mg | ORAL_TABLET | Freq: Every day | ORAL | Status: DC
  Administered 2019-01-20: 0.5 mg via ORAL
  Filled 2019-01-20: qty 0.5

## 2019-01-20 MED ORDER — PROPOFOL 10 MG/ML IV BOLUS
INTRAVENOUS | Status: AC
  Filled 2019-01-20: qty 60

## 2019-01-20 MED ORDER — BUPIVACAINE-EPINEPHRINE (PF) 0.25% -1:200000 IJ SOLN
INTRAMUSCULAR | Status: AC
  Filled 2019-01-20: qty 30

## 2019-01-20 MED ORDER — ZOLPIDEM TARTRATE 5 MG PO TABS: 5.0000 mg | ORAL_TABLET | Freq: Every evening | ORAL | Status: DC | PRN

## 2019-01-20 MED ORDER — FLEET ENEMA 7-19 GM/118ML RE ENEM: 1.0000 | ENEMA | Freq: Once | RECTAL | Status: DC | PRN

## 2019-01-20 MED ORDER — ONDANSETRON HCL 4 MG/2ML IJ SOLN
INTRAMUSCULAR | Status: DC | PRN
  Administered 2019-01-20: 4 mg via INTRAVENOUS

## 2019-01-20 MED ORDER — CEFAZOLIN SODIUM-DEXTROSE 2-4 GM/100ML-% IV SOLN
2.0000 g | Freq: Four times a day (QID) | INTRAVENOUS | Status: AC
  Administered 2019-01-20 (×2): 2 g via INTRAVENOUS
  Filled 2019-01-20 (×2): qty 100

## 2019-01-20 MED ORDER — SODIUM CHLORIDE 0.9 % IR SOLN
Status: DC | PRN
  Administered 2019-01-20: 1000 mL

## 2019-01-20 MED ORDER — ALUM & MAG HYDROXIDE-SIMETH 200-200-20 MG/5ML PO SUSP: 30.0000 mL | ORAL | Status: DC | PRN

## 2019-01-20 MED ORDER — STERILE WATER FOR INJECTION IJ SOLN
INTRAMUSCULAR | Status: DC | PRN
  Administered 2019-01-20: 2000 mL

## 2019-01-20 MED ORDER — PROPOFOL 500 MG/50ML IV EMUL
INTRAVENOUS | Status: DC | PRN
  Administered 2019-01-20: 75 ug/kg/min via INTRAVENOUS

## 2019-01-20 MED ORDER — TRANEXAMIC ACID-NACL 1000-0.7 MG/100ML-% IV SOLN
1000.0000 mg | INTRAVENOUS | Status: AC
  Administered 2019-01-20: 1000 mg via INTRAVENOUS

## 2019-01-20 MED ORDER — PHENOL 1.4 % MT LIQD
1.0000 | OROMUCOSAL | Status: DC | PRN
  Filled 2019-01-20: qty 177

## 2019-01-20 MED ORDER — HYDROMORPHONE HCL 1 MG/ML IJ SOLN
0.5000 mg | INTRAMUSCULAR | Status: DC | PRN
  Administered 2019-01-20: 0.5 mg via INTRAVENOUS
  Filled 2019-01-20: qty 1

## 2019-01-20 MED ORDER — ONDANSETRON HCL 4 MG/2ML IJ SOLN
INTRAMUSCULAR | Status: AC
  Filled 2019-01-20: qty 2

## 2019-01-20 MED ORDER — METHOCARBAMOL 500 MG IVPB - SIMPLE MED
500.0000 mg | Freq: Four times a day (QID) | INTRAVENOUS | Status: DC | PRN
  Filled 2019-01-20: qty 50

## 2019-01-20 MED ORDER — LACTATED RINGERS IV SOLN
INTRAVENOUS | Status: DC
  Administered 2019-01-20 (×2): via INTRAVENOUS

## 2019-01-20 MED ORDER — CLONIDINE HCL (ANALGESIA) 100 MCG/ML EP SOLN
EPIDURAL | Status: DC | PRN
  Administered 2019-01-20: 50 ug

## 2019-01-20 MED ORDER — SODIUM CHLORIDE 0.9 % IV SOLN
INTRAVENOUS | Status: DC
  Administered 2019-01-20: 11:00:00 via INTRAVENOUS

## 2019-01-20 MED ORDER — TRAMADOL HCL 50 MG PO TABS
50.0000 mg | ORAL_TABLET | Freq: Four times a day (QID) | ORAL | Status: DC
  Administered 2019-01-20 – 2019-01-21 (×3): 50 mg via ORAL
  Filled 2019-01-20 (×3): qty 1

## 2019-01-20 MED ORDER — DEXAMETHASONE SODIUM PHOSPHATE 10 MG/ML IJ SOLN
8.0000 mg | Freq: Once | INTRAMUSCULAR | Status: AC
  Administered 2019-01-20: 10 mg via INTRAVENOUS

## 2019-01-20 MED ORDER — ACETAMINOPHEN 500 MG PO TABS
ORAL_TABLET | ORAL | Status: AC
  Administered 2019-01-20: 1000 mg via ORAL
  Filled 2019-01-20: qty 2

## 2019-01-20 MED ORDER — ALPRAZOLAM 0.25 MG PO TABS
0.2500 mg | ORAL_TABLET | Freq: Every day | ORAL | Status: DC
  Administered 2019-01-20: 0.5 mg via ORAL
  Filled 2019-01-20: qty 2

## 2019-01-20 MED ORDER — TRANEXAMIC ACID-NACL 1000-0.7 MG/100ML-% IV SOLN
INTRAVENOUS | Status: AC
  Filled 2019-01-20: qty 100

## 2019-01-20 MED ORDER — LEVOTHYROXINE SODIUM 112 MCG PO TABS
112.0000 ug | ORAL_TABLET | Freq: Every day | ORAL | Status: DC
  Administered 2019-01-21: 112 ug via ORAL
  Filled 2019-01-20: qty 1

## 2019-01-20 MED ORDER — PROPOFOL 10 MG/ML IV BOLUS
INTRAVENOUS | Status: DC | PRN
  Administered 2019-01-20: 30 mg via INTRAVENOUS

## 2019-01-20 MED ORDER — ONDANSETRON HCL 4 MG PO TABS: 4.0000 mg | ORAL_TABLET | Freq: Four times a day (QID) | ORAL | Status: DC | PRN

## 2019-01-20 MED ORDER — PANTOPRAZOLE SODIUM 40 MG PO TBEC
40.0000 mg | DELAYED_RELEASE_TABLET | Freq: Every day | ORAL | Status: DC
  Administered 2019-01-20 – 2019-01-21 (×2): 40 mg via ORAL
  Filled 2019-01-20 (×2): qty 1

## 2019-01-20 MED ORDER — EPHEDRINE SULFATE-NACL 50-0.9 MG/10ML-% IV SOSY
PREFILLED_SYRINGE | INTRAVENOUS | Status: DC | PRN
  Administered 2019-01-20 (×2): 5 mg via INTRAVENOUS
  Administered 2019-01-20: 10 mg via INTRAVENOUS

## 2019-01-20 MED ORDER — MENTHOL 3 MG MT LOZG: 1.0000 | LOZENGE | OROMUCOSAL | Status: DC | PRN

## 2019-01-20 MED ORDER — PHENYLEPHRINE 40 MCG/ML (10ML) SYRINGE FOR IV PUSH (FOR BLOOD PRESSURE SUPPORT)
PREFILLED_SYRINGE | INTRAVENOUS | Status: AC
  Filled 2019-01-20: qty 10

## 2019-01-20 MED ORDER — ATORVASTATIN CALCIUM 10 MG PO TABS
10.0000 mg | ORAL_TABLET | Freq: Every day | ORAL | Status: DC
  Administered 2019-01-20: 10 mg via ORAL
  Filled 2019-01-20: qty 1

## 2019-01-20 MED ORDER — DOCUSATE SODIUM 100 MG PO CAPS
100.0000 mg | ORAL_CAPSULE | Freq: Two times a day (BID) | ORAL | Status: DC
  Administered 2019-01-20 – 2019-01-21 (×2): 100 mg via ORAL
  Filled 2019-01-20 (×2): qty 1

## 2019-01-20 SURGICAL SUPPLY — 55 items
ARTISURF 12M PLY R 6-9CD KNEE (Knees) ×2 IMPLANT
BAG ZIPLOCK 12X15 (MISCELLANEOUS) ×2 IMPLANT
BANDAGE ACE 6X5 VEL STRL LF (GAUZE/BANDAGES/DRESSINGS) ×2 IMPLANT
BANDAGE ELASTIC 6 VELCRO ST LF (GAUZE/BANDAGES/DRESSINGS) ×2 IMPLANT
BLADE SAGITTAL 13X1.27X60 (BLADE) ×2 IMPLANT
BLADE SAW SGTL 83.5X18.5 (BLADE) ×2 IMPLANT
BLADE SURG 15 STRL LF DISP TIS (BLADE) ×1 IMPLANT
BLADE SURG 15 STRL SS (BLADE) ×1
BLADE SURG SZ10 CARB STEEL (BLADE) ×4 IMPLANT
BOWL SMART MIX CTS (DISPOSABLE) ×2 IMPLANT
CEMENT BONE SIMPLEX SPEEDSET (Cement) ×4 IMPLANT
CHLORAPREP W/TINT 26 (MISCELLANEOUS) ×4 IMPLANT
COVER SURGICAL LIGHT HANDLE (MISCELLANEOUS) ×2 IMPLANT
COVER WAND RF STERILE (DRAPES) IMPLANT
CUFF TOURN SGL QUICK 34 (TOURNIQUET CUFF) ×1
CUFF TRNQT CYL 34X4.125X (TOURNIQUET CUFF) ×1 IMPLANT
DECANTER SPIKE VIAL GLASS SM (MISCELLANEOUS) ×4 IMPLANT
DRAPE INCISE IOBAN 66X45 STRL (DRAPES) ×4 IMPLANT
DRAPE U-SHAPE 47X51 STRL (DRAPES) ×2 IMPLANT
DRSG AQUACEL AG ADV 3.5X10 (GAUZE/BANDAGES/DRESSINGS) ×2 IMPLANT
ELECT REM PT RETURN 15FT ADLT (MISCELLANEOUS) ×2 IMPLANT
FEMORAL KNEE COMPONENT SZ6 RT (Joint) ×2 IMPLANT
GLOVE BIOGEL M STRL SZ7.5 (GLOVE) ×2 IMPLANT
GLOVE BIOGEL PI IND STRL 7.5 (GLOVE) ×1 IMPLANT
GLOVE BIOGEL PI IND STRL 8.5 (GLOVE) ×2 IMPLANT
GLOVE BIOGEL PI INDICATOR 7.5 (GLOVE) ×1
GLOVE BIOGEL PI INDICATOR 8.5 (GLOVE) ×2
GLOVE SURG ORTHO 8.0 STRL STRW (GLOVE) ×6 IMPLANT
GOWN STRL REUS W/ TWL XL LVL3 (GOWN DISPOSABLE) ×2 IMPLANT
GOWN STRL REUS W/TWL XL LVL3 (GOWN DISPOSABLE) ×2
HANDPIECE INTERPULSE COAX TIP (DISPOSABLE) ×1
HOLDER FOLEY CATH W/STRAP (MISCELLANEOUS) ×2 IMPLANT
HOOD PEEL AWAY FLYTE STAYCOOL (MISCELLANEOUS) ×6 IMPLANT
KIT TURNOVER KIT A (KITS) IMPLANT
MANIFOLD NEPTUNE II (INSTRUMENTS) ×2 IMPLANT
NEEDLE HYPO 22GX1.5 SAFETY (NEEDLE) ×2 IMPLANT
NS IRRIG 1000ML POUR BTL (IV SOLUTION) ×2 IMPLANT
PACK TOTAL KNEE CUSTOM (KITS) ×2 IMPLANT
PROTECTOR NERVE ULNAR (MISCELLANEOUS) ×2 IMPLANT
SET HNDPC FAN SPRY TIP SCT (DISPOSABLE) ×1 IMPLANT
STEM POLY PAT PLY 32M KNEE (Knees) ×2 IMPLANT
STEM TIBIA 5 DEG SZ C R KNEE (Knees) ×1 IMPLANT
STRIP CLOSURE SKIN 1/2X4 (GAUZE/BANDAGES/DRESSINGS) ×2 IMPLANT
SUT BONE WAX W31G (SUTURE) ×2 IMPLANT
SUT MNCRL AB 3-0 PS2 18 (SUTURE) ×2 IMPLANT
SUT STRATAFIX 0 PDS 27 VIOLET (SUTURE) ×2
SUT STRATAFIX PDS+ 0 24IN (SUTURE) ×2 IMPLANT
SUT VIC AB 1 CT1 36 (SUTURE) ×2 IMPLANT
SUTURE STRATFX 0 PDS 27 VIOLET (SUTURE) ×1 IMPLANT
SYR CONTROL 10ML LL (SYRINGE) ×4 IMPLANT
TIBIA STEM 5 DEG SZ C R KNEE (Knees) ×2 IMPLANT
TRAY FOLEY MTR SLVR 16FR STAT (SET/KITS/TRAYS/PACK) ×2 IMPLANT
WATER STERILE IRR 1000ML POUR (IV SOLUTION) ×4 IMPLANT
WRAP KNEE MAXI GEL POST OP (GAUZE/BANDAGES/DRESSINGS) ×2 IMPLANT
YANKAUER SUCT BULB TIP 10FT TU (MISCELLANEOUS) ×2 IMPLANT

## 2019-01-20 NOTE — Evaluation (Signed)
Physical Therapy Evaluation Patient Details Name: Linda Rose MRN: [id number] DOB: [birth year] Today's Date: 01/20/2019   History of Present Illness  68 yo female s/p R TKA 01/20/19.   Clinical Impression  On eval POD 0, pt required Min assist for mobility. She walked ~40 feet with a RW. Minimal pain with activity. Pt had R knee buckling during ambulation. Will follow and progress activity as tolerated.     Follow Up Recommendations Follow surgeon's recommendation for DC plan and follow-up therapies    Equipment Recommendations  None recommended by PT    Recommendations for Other Services       Precautions / Restrictions Precautions Precautions: Fall Restrictions Weight Bearing Restrictions: No Other Position/Activity Restrictions: WBAT      Mobility  Bed Mobility Overal bed mobility: Needs Assistance Bed Mobility: Supine to Sit     Supine to sit: Min guard     General bed mobility comments: close guard for safety.   Transfers Overall transfer level: Needs assistance Equipment used: Rolling walker (2 wheeled) Transfers: Sit to/from Stand Sit to Stand: Min assist         General transfer comment: VCs safety, technique, hand/LE placement. Assist to stabilize.   Ambulation/Gait Ambulation/Gait assistance: Min assist Gait Distance (Feet): 40 Feet Assistive device: Rolling walker (2 wheeled) Gait Pattern/deviations: Step-to pattern     General Gait Details: VCs safety, technique, sequence. Assist to stabilize throughout distance 2* R knee instability/intermittent buckling.   Stairs            Wheelchair Mobility    Modified Rankin (Stroke Patients Only)       Balance Overall balance assessment: Needs assistance         Standing balance support: Bilateral upper extremity supported Standing balance-Leahy Scale: Poor                               Pertinent Vitals/Pain Pain Assessment: 0-10 Pain Score: 3  Pain Location: R  knee/LE Pain Descriptors / Indicators: Numbness Pain Intervention(s): Limited activity within patient's tolerance;Repositioned    Home Living Family/patient expects to be discharged to:: Private residence Living Arrangements: Alone   Type of Home: House Home Access: Stairs to enter Entrance Stairs-Rails: Right;Left;Can reach both Entrance Stairs-Number of Steps: 8 Home Layout: One level Home Equipment: Walker - 2 wheels Additional Comments: Pt will be staying with sister. 3+1 steps (has 2 rails but can only use 1)    Prior Function Level of Independence: Independent               Hand Dominance        Extremity/Trunk Assessment   Upper Extremity Assessment Upper Extremity Assessment: Overall WFL for tasks assessed    Lower Extremity Assessment Lower Extremity Assessment: (post op weakness R TKA)    Cervical / Trunk Assessment Cervical / Trunk Assessment: Normal  Communication   Communication: No difficulties  Cognition Arousal/Alertness: Awake/alert Behavior During Therapy: WFL for tasks assessed/performed Overall Cognitive Status: Within Functional Limits for tasks assessed                                        General Comments      Exercises     Assessment/Plan    PT Assessment Patient needs continued PT services  PT Problem List Decreased strength;Decreased balance;Decreased mobility;Decreased range of motion;Decreased activity tolerance;Pain;Decreased  knowledge of use of DME       PT Treatment Interventions DME instruction;Functional mobility training;Balance training;Patient/family education;Gait training;Therapeutic activities;Therapeutic exercise;Stair training    PT Goals (Current goals can be found in the Care Plan section)  Acute Rehab PT Goals Patient Stated Goal: regain independence and PLOF PT Goal Formulation: With patient Time For Goal Achievement: 02/03/19 Potential to Achieve Goals: Good    Frequency      Barriers to discharge        Co-evaluation               AM-PAC PT "6 Clicks" Mobility  Outcome Measure Help needed turning from your back to your side while in a flat bed without using bedrails?: A Little Help needed moving from lying on your back to sitting on the side of a flat bed without using bedrails?: A Little Help needed moving to and from a bed to a chair (including a wheelchair)?: A Little Help needed standing up from a chair using your arms (e.g., wheelchair or bedside chair)?: A Little Help needed to walk in hospital room?: A Little Help needed climbing 3-5 steps with a railing? : A Little 6 Click Score: 18    End of Session Equipment Utilized During Treatment: Gait belt Activity Tolerance: Patient tolerated treatment well Patient left: in chair;with call bell/phone within reach   PT Visit Diagnosis: Unsteadiness on feet (R26.81);Other abnormalities of gait and mobility (R26.89)    Time: 2957-4734 PT Time Calculation (min) (ACUTE ONLY): 18 min   Charges:   PT Evaluation $PT Eval Low Complexity: [place], PT Acute Rehabilitation Services Pager: [phone] Office: [phone]

## 2019-01-20 NOTE — H&P (Signed)
Linda Rose MRN:  [id number] DOB/SEX:  1951-10-22/female  CHIEF COMPLAINT:  Painful right Knee  HISTORY: Patient is a 68 y.o. female presented with a history of pain in the right knee. Onset of symptoms was gradual starting a few years ago with gradually worsening course since that time. Patient has been treated conservatively with over-the-counter NSAIDs and activity modification. Patient currently rates pain in the knee at 10 out of 10 with activity. There is pain at night.  PAST MEDICAL HISTORY: Patient Active Problem List   Diagnosis Date Noted  . Hyperlipidemia 01/26/2017  . Bilateral carotid artery stenosis 01/26/2017   Past Medical History:  Diagnosis Date  . GERD (gastroesophageal reflux disease)   . Hyperlipidemia   . Migraines   . Thyroid disease    Past Surgical History:  Procedure Laterality Date  . ABDOMINAL HYSTERECTOMY    . FRACTURE SURGERY     Tibulala and Fibula   . SPINAL FUSION    . TRIGGER FINGER RELEASE Bilateral 10/2017   x 4      MEDICATIONS:   Medications Prior to Admission  Medication Sig Dispense Refill Last Dose  . acetaminophen (TYLENOL) 650 MG CR tablet Take 650 mg by mouth daily as needed for pain.   01/19/2019 at Unknown time  . ALPRAZolam (XANAX) 0.25 MG tablet Take 0.25-0.5 mg by mouth at bedtime.    01/19/2019 at Unknown time  . aspirin EC 81 MG tablet Take 81 mg by mouth See admin instructions. Take 81 mg daily, May take an additional 81 mg dose as needed for knee pain   01/11/2019 at Unknown time  . atorvastatin (LIPITOR) 10 MG tablet Take 10 mg by mouth at bedtime.    01/19/2019 at Unknown time  . Azelastine-Fluticasone (DYMISTA) 137-50 MCG/ACT SUSP Place 1 spray into the nose daily as needed (allergies).   01/19/2019 at Unknown time  . Biotin 5000 MCG CAPS Take 5,000 mcg by mouth daily.     . cetirizine (ZYRTEC) 10 MG tablet Take 10 mg by mouth daily as needed for allergies.   01/19/2019 at Unknown time  . diclofenac sodium (VOLTAREN) 1 % GEL  Apply 2 g topically 4 (four) times daily. Rub into affected area of foot 2 to 4 times daily (Patient taking differently: Apply 2 g topically 4 (four) times daily as needed (pain). ) 100 g 2 Past Month at Unknown time  . docusate sodium (COLACE) 100 MG capsule Take 100 mg by mouth daily as needed for mild constipation.   Past Week at Unknown time  . estradiol (ESTRACE) 0.5 MG tablet Take 0.5 mg by mouth at bedtime.    01/19/2019 at Unknown time  . levothyroxine (SYNTHROID, LEVOTHROID) 112 MCG tablet Take 112 mcg by mouth daily.    01/20/2019 at 0430  . meloxicam (MOBIC) 15 MG tablet Take 15 mg by mouth at bedtime.    Taking  . Multiple Vitamins-Minerals (PRESERVISION AREDS 2) CAPS Take 1 capsule by mouth 2 (two) times daily.   Past Week at Unknown time  . neomycin-bacitracin-polymyxin (NEOSPORIN) ointment Apply 1 application topically as needed for wound care.     . Olopatadine HCl (PATADAY) 0.2 % SOLN Place 1 drop into both eyes daily as needed (allergies).   Past Week at Unknown time  . omeprazole (PRILOSEC) 40 MG capsule Take 40 mg by mouth daily as needed (acid reflux).    01/19/2019 at Unknown time  . Multiple Minerals-Vitamins (CAL-MAG-ZINC-D) TABS Take 1 tablet by mouth 2 (two) times  daily.   More than a month at Unknown time  . SUMAtriptan (IMITREX) 100 MG tablet Take 50 mg by mouth every 2 (two) hours as needed for migraine.    More than a month at Unknown time    ALLERGIES:   Allergies  Allergen Reactions  . Metrizamide Rash  . Iodinated Diagnostic Agents Rash    REVIEW OF SYSTEMS:  A comprehensive review of systems was negative except for: Musculoskeletal: positive for arthralgias and bone pain   FAMILY HISTORY:   Family History  Problem Relation Age of Onset  . Diabetes Mother   . Hypertension Mother   . Heart failure Mother   . Thyroid disease Father     SOCIAL HISTORY:   Social History   Tobacco Use  . Smoking status: Former Smoker    Packs/day: 0.50    Years: 20.00     Pack years: 10.00    Types: Cigarettes  . Smokeless tobacco: Never Used  Substance Use Topics  . Alcohol use: No    Alcohol/week: 0.0 standard drinks     EXAMINATION:  Vital signs in last 24 hours: Temp:  [98 F (36.7 C)] 98 F (36.7 C) (03/16 0531) Pulse Rate:  [80] 80 (03/16 0531) Resp:  [18] 18 (03/16 0531) BP: (154)/(73) 154/73 (03/16 0531) SpO2:  [100 %] 100 % (03/16 0531)  BP (!) 154/73   Pulse 80   Temp 98 F (36.7 C) (Oral)   Resp 18   LMP  (LMP Unknown)   SpO2 100%   General Appearance:    Alert, cooperative, no distress, appears stated age  Head:    Normocephalic, without obvious abnormality, atraumatic  Eyes:    PERRL, conjunctiva/corneas clear, EOM's intact, fundi    benign, both eyes  Ears:    Normal TM's and external ear canals, both ears  Nose:   Nares normal, septum midline, mucosa normal, no drainage    or sinus tenderness  Throat:   Lips, mucosa, and tongue normal; teeth and gums normal  Neck:   Supple, symmetrical, trachea midline, no adenopathy;    thyroid:  no enlargement/tenderness/nodules; no carotid   bruit or JVD  Back:     Symmetric, no curvature, ROM normal, no CVA tenderness  Lungs:     Clear to auscultation bilaterally, respirations unlabored  Chest Wall:    No tenderness or deformity   Heart:    Regular rate and rhythm, S1 and S2 normal, no murmur, rub   or gallop  Breast Exam:    No tenderness, masses, or nipple abnormality  Abdomen:     Soft, non-tender, bowel sounds active all four quadrants,    no masses, no organomegaly  Genitalia:    Normal female without lesion, discharge or tenderness  Rectal:    Normal tone, no masses or tenderness;   guaiac negative stool  Extremities:   Extremities normal, atraumatic, no cyanosis or edema  Pulses:   2+ and symmetric all extremities  Skin:   Skin color, texture, turgor normal, no rashes or lesions  Lymph nodes:   Cervical, supraclavicular, and axillary nodes normal  Neurologic:   CNII-XII  intact, normal strength, sensation and reflexes    throughout    Musculoskeletal:  ROM 0-120, Ligaments intact,  Imaging Review Plain radiographs demonstrate severe degenerative joint disease of the right knee. The overall alignment is neutral. The bone quality appears to be good for age and reported activity level.  Assessment/Plan: Primary osteoarthritis, right knee   The patient  history, physical examination and imaging studies are consistent with advanced degenerative joint disease of the right knee. The patient has failed conservative treatment.  The clearance notes were reviewed.  After discussion with the patient it was felt that Total Knee Replacement was indicated. The procedure,  risks, and benefits of total knee arthroplasty were presented and reviewed. The risks including but not limited to aseptic loosening, infection, blood clots, vascular injury, stiffness, patella tracking problems complications among others were discussed. The patient acknowledged the explanation, agreed to proceed with the plan.  Preoperative templating of the joint replacement has been completed, documented, and submitted to the Operating Room personnel in order to optimize intra-operative equipment management.    Patient's anticipated LOS is less than 2 midnights, meeting these requirements: - Younger than 61 - Lives within 1 hour of care - Has a competent adult at home to recover with post-op recover - NO history of  - Chronic pain requiring opiods  - Diabetes  - Coronary Artery Disease  - Heart failure  - Heart attack  - Stroke  - DVT/VTE  - Cardiac arrhythmia  - Respiratory Failure/COPD  - Renal failure  - Anemia  - Advanced Liver disease       Donia Ast 01/20/2019, 6:17 AM

## 2019-01-20 NOTE — Anesthesia Procedure Notes (Signed)
Spinal  Patient location during procedure: OR Start time: 01/20/2019 7:25 AM Staffing Anesthesiologist: Nolon Nations, MD Performed: anesthesiologist  Preanesthetic Checklist Completed: patient identified, site marked, surgical consent, pre-op evaluation, timeout performed, IV checked, risks and benefits discussed and monitors and equipment checked Spinal Block Patient position: sitting Prep: site prepped and draped and DuraPrep Patient monitoring: heart rate, continuous pulse ox and blood pressure Approach: midline Location: L3-4 Injection technique: single-shot Needle Needle type: Sprotte  Needle gauge: 24 G Needle length: 9 cm Assessment Sensory level: T6 Additional Notes Expiration date of kit checked and confirmed. Patient tolerated procedure well, without complications.

## 2019-01-20 NOTE — Anesthesia Procedure Notes (Signed)
Spinal  Patient location during procedure: OR Staffing Anesthesiologist: Shawna Kiener, MD Performed: anesthesiologist  Preanesthetic Checklist Completed: patient identified, site marked, surgical consent, pre-op evaluation, timeout performed, IV checked, risks and benefits discussed and monitors and equipment checked Spinal Block Patient position: sitting Prep: site prepped and draped and DuraPrep Patient monitoring: heart rate, continuous pulse ox and blood pressure Approach: right paramedian Location: L3-4 Injection technique: single-shot Needle Needle type: Sprotte  Needle gauge: 24 G Needle length: 9 cm Additional Notes Expiration date of kit checked and confirmed. Patient tolerated procedure well, without complications.       

## 2019-01-20 NOTE — Anesthesia Postprocedure Evaluation (Signed)
Anesthesia Post Note  Patient: Linda Rose  Procedure(s) Performed: TOTAL KNEE ARTHROPLASTY (Right Knee)     Patient location during evaluation: PACU Anesthesia Type: Spinal Level of consciousness: awake and alert Pain management: pain level controlled Vital Signs Assessment: post-procedure vital signs reviewed and stable Respiratory status: spontaneous breathing Cardiovascular status: stable Postop Assessment: spinal receding Anesthetic complications: no    Last Vitals:  Vitals:   01/20/19 1212 01/20/19 1311  BP: 123/70 (!) 123/58  Pulse: 72 67  Resp: 16 16  Temp: (!) 36.4 C 36.5 C  SpO2: 99% 100%    Last Pain:  Vitals:   01/20/19 1422  TempSrc:   PainSc: [place]

## 2019-01-20 NOTE — Anesthesia Procedure Notes (Signed)
Anesthesia Regional Block: Adductor canal block   Pre-Anesthetic Checklist: ,, timeout performed, Correct Patient, Correct Site, Correct Laterality, Correct Procedure, Correct Position, site marked, Risks and benefits discussed, Surgical consent,  Pre-op evaluation,  Post-op pain management  Laterality: Right  Prep: chloraprep       Needles:  Injection technique: Single-shot  Needle Type: Stimiplex     Needle Length: 9cm  Needle Gauge: 21     Additional Needles:   Narrative:  Start time: 01/20/2019 7:12 AM End time: 01/20/2019 7:15 AM Injection made incrementally with aspirations every 5 mL.  Performed by: Personally  Anesthesiologist: Nolon Nations, MD  Additional Notes: BP cuff, EKG monitors applied. Sedation begun. Artery and nerve location verified with U/S and anesthetic injected incrementally, slowly, and after negative aspirations under direct u/s guidance. Good fascial /perineural spread. Tolerated well.

## 2019-01-20 NOTE — Transfer of Care (Signed)
Immediate Anesthesia Transfer of Care Note  Patient: Linda Rose  Procedure(s) Performed: TOTAL KNEE ARTHROPLASTY (Right Knee)  Patient Location: PACU  Anesthesia Type:Spinal  Level of Consciousness: awake, alert , oriented and patient cooperative  Airway & Oxygen Therapy: Patient Spontanous Breathing and Patient connected to face mask oxygen  Post-op Assessment: Report given to RN, Post -op Vital signs reviewed and stable and Patient moving all extremities  Post vital signs: Reviewed and stable  Last Vitals:  Vitals Value Taken Time  BP    Temp    Pulse 84 01/20/2019  8:47 AM  Resp 19 01/20/2019  8:49 AM  SpO2 100 % 01/20/2019  8:47 AM  Vitals shown include unvalidated device data.  Last Pain:  Vitals:   01/20/19 0531  TempSrc: Oral         Complications: No apparent anesthesia complications

## 2019-01-20 NOTE — Op Note (Signed)
TOTAL KNEE REPLACEMENT OPERATIVE NOTE:  01/20/2019  11:18 AM  PATIENT:  Linda Rose  68 y.o. female  PRE-OPERATIVE DIAGNOSIS:  RT. KNEE OSTEOARTHRITIS  POST-OPERATIVE DIAGNOSIS:  RT. KNEE OSTEOARTHRITIS  PROCEDURE:  Procedure(s): TOTAL KNEE ARTHROPLASTY  SURGEON:  Surgeon(s): Vickey Huger, MD  PHYSICIAN ASSISTANT: Carlyon Shadow, PA-C   ANESTHESIA:   spinal  SPECIMEN: None  COUNTS:  Correct  TOURNIQUET:   Total Tourniquet Time Documented: Thigh (Right) - 35 minutes Total: Thigh (Right) - 35 minutes   DICTATION:  Indication for procedure:    The patient is a 68 y.o. female who has failed conservative treatment for RT. KNEE OSTEOARTHRITIS.  Informed consent was obtained prior to anesthesia. The risks versus benefits of the operation were explain and in a way the patient can, and did, understand.     Description of procedure:     The patient was taken to the operating room and placed under anesthesia.  The patient was positioned in the usual fashion taking care that all body parts were adequately padded and/or protected.  A tourniquet was applied and the leg prepped and draped in the usual sterile fashion.  The extremity was exsanguinated with the esmarch and tourniquet inflated to 350 mmHg.  Pre-operative range of motion was normal.    A midline incision approximately 6-7 inches long was made with a #10 blade.  A new blade was used to make a parapatellar arthrotomy going 2-3 cm into the quadriceps tendon, over the patella, and alongside the medial aspect of the patellar tendon.  A synovectomy was then performed with the #10 blade and forceps. I then elevated the deep MCL off the medial tibial metaphysis subperiosteally around to the semimembranosus attachment.    I everted the patella and used calipers to measure patellar thickness.  I used the reamer to ream down to appropriate thickness to recreate the native thickness.  I then removed excess bone with the rongeur and  sagittal saw.  I used the appropriately sized template and drilled the three lug holes.  I then put the trial in place and measured the thickness with the calipers to ensure recreation of the native thickness.  The trial was then removed and the patella subluxed and the knee brought into flexion.  A homan retractor was place to retract and protect the patella and lateral structures.  A Z-retractor was place medially to protect the medial structures.  The extra-medullary alignment system was used to make cut the tibial articular surface perpendicular to the anamotic axis of the tibia and in 3 degrees of posterior slope.  The cut surface and alignment jig was removed.  I then used the intramedullary alignment guide to make a valgus cut on the distal femur.  I then marked out the epicondylar axis on the distal femur.  I then used the anterior referencing sizer and measured the femur to be a size 6.  The 4-In-1 cutting block was screwed into place in external rotation matching the posterior condylar angle, making our cuts perpendicular to the epicondylar axis.  Anterior, posterior and chamfer cuts were made with the sagittal saw.  The cutting block and cut pieces were removed.  A lamina spreader was placed in 90 degrees of flexion.  The ACL, PCL, menisci, and posterior condylar osteophytes were removed.  A 10 mm spacer blocked was found to offer good flexion and extension gap balance after minimal in degree releasing.   The scoop retractor was then placed and the femoral finishing block  was pinned in place.  The small sagittal saw was used as well as the lug drill to finish the femur.  The block and cut surfaces were removed and the medullary canal hole filled with autograft bone from the cut pieces.  The tibia was delivered forward in deep flexion and external rotation.  A size C tray was selected and pinned into place centered on the medial 1/3 of the tibial tubercle.  The reamer and keel was used to prepare  the tibia through the tray.    I then trialed with the size 6 femur, size C tibia, a 12 mm insert and the 32 patella.  I had excellent flexion/extension gap balance, excellent patella tracking.  Flexion was full and beyond 120 degrees; extension was zero.  These components were chosen and the staff opened them to me on the back table while the knee was lavaged copiously and the cement mixed.  The soft tissue was infiltrated with 60cc of exparel 1.3% through a 21 gauge needle.  I cemented in the components and removed all excess cement.  The polyethylene tibial component was snapped into place and the knee placed in extension while cement was hardening.  The capsule was infilltrated with a 60cc exparel/marcaine/saline mixture.   Once the cement was hard, the tourniquet was let down.  Hemostasis was obtained.  The arthrotomy was closed using a #1 stratofix running suture.  The deep soft tissues were closed with #0 vicryls and the subcuticular layer closed with #2-0 vicryl.  The skin was reapproximated and closed with 3.0 Monocryl.  The wound was covered with steristrips, aquacel dressing, and a TED stocking.   The patient was then awakened, extubated, and taken to the recovery room in stable condition.  BLOOD LOSS:  825OI COMPLICATIONS:  None.  PLAN OF CARE: Admit for overnight observation  PATIENT DISPOSITION:  PACU - hemodynamically stable.    Please fax a copy of this op note to my office at [phone] (please only include page 1 and 2 of the Case Information op note)

## 2019-01-21 DIAGNOSIS — Z96651 Presence of right artificial knee joint: Secondary | ICD-10-CM | POA: Diagnosis not present

## 2019-01-21 LAB — BASIC METABOLIC PANEL
Anion gap: 4 — ABNORMAL LOW (ref 5–15)
BUN: 14 mg/dL (ref 8–23)
CO2: 26 mmol/L (ref 22–32)
Calcium: 8.5 mg/dL — ABNORMAL LOW (ref 8.9–10.3)
Chloride: 111 mmol/L (ref 98–111)
Creatinine, Ser: 0.59 mg/dL (ref 0.44–1.00)
GFR calc non Af Amer: >60 mL/min (ref >60)
Glucose, Bld: 118 mg/dL — ABNORMAL HIGH (ref 70–99)
Potassium: 4.5 mmol/L (ref 3.5–5.1)
Sodium: 141 mmol/L (ref 135–145)

## 2019-01-21 LAB — CBC
HCT: 31.1 % — ABNORMAL LOW (ref 36.0–46.0)
Hemoglobin: 9.8 g/dL — ABNORMAL LOW (ref 12.0–15.0)
MCH: 31.6 pg (ref 26.0–34.0)
MCHC: 31.5 g/dL (ref 30.0–36.0)
MCV: 100.3 fL — ABNORMAL HIGH (ref 80.0–100.0)
Platelets: 173 10*3/uL (ref 150–400)
RBC: 3.1 MIL/uL — ABNORMAL LOW (ref 3.87–5.11)
RDW: 12.7 % (ref 11.5–15.5)
WBC: 10 10*3/uL (ref 4.0–10.5)
nRBC: 0 % (ref 0.0–0.2)

## 2019-01-21 MED ORDER — ASPIRIN 325 MG PO TBEC: 325.0000 mg | DELAYED_RELEASE_TABLET | Freq: Two times a day (BID) | ORAL | 0 refills | Status: DC

## 2019-01-21 MED ORDER — OXYCODONE HCL 5 MG PO TABS: 5.0000 mg | ORAL_TABLET | Freq: Four times a day (QID) | ORAL | 0 refills | Status: DC | PRN

## 2019-01-21 MED ORDER — METHOCARBAMOL 500 MG PO TABS: 500.0000 mg | ORAL_TABLET | Freq: Four times a day (QID) | ORAL | 0 refills | Status: DC | PRN

## 2019-01-21 NOTE — Progress Notes (Signed)
SPORTS MEDICINE AND JOINT REPLACEMENT  Lara Mulch, MD    Carlyon Shadow, PA-C [place], [place], [place]  [id number]                             [phone]   PROGRESS NOTE  Subjective:  negative for Chest Pain  negative for Shortness of Breath  negative for Nausea/Vomiting   negative for Calf Pain  negative for Bowel Movement   Tolerating Diet: yes         Patient reports pain as 3 on 0-10 scale.    Objective: Vital signs in last 24 hours:    Patient Vitals for the past 24 hrs:  BP Temp Temp src Pulse Resp SpO2 Height Weight  01/21/19 0537 (!) 106/56 98.2 F (36.8 C) Oral 61 17 99 % - -  01/21/19 0213 (!) 100/53 97.9 F (36.6 C) Oral (!) 55 17 100 % - -  01/20/19 2203 (!) 116/58 (!) 97.2 F (36.2 C) Axillary (!) 57 17 100 % - -  01/20/19 1753 (!) 120/55 (!) 97.5 F (36.4 C) - 61 - 99 % - -  01/20/19 1311 (!) 123/58 97.7 F (36.5 C) Oral 67 16 100 % - -  01/20/19 1212 123/70 (!) 97.5 F (36.4 C) Oral 72 16 99 % - -  01/20/19 1113 134/82 97.7 F (36.5 C) Oral 75 16 100 % - -  01/20/19 1017 130/67 - - 70 16 100 % 5\' 1"  (1.549 m) 66.8 kg  01/20/19 1000 129/62 - - 61 11 100 % - -  01/20/19 0945 123/70 - - 75 13 100 % - -  01/20/19 0930 134/77 - - 73 14 100 % - -  01/20/19 0915 121/67 - - 63 14 100 % - -  01/20/19 0900 131/74 - - 61 12 100 % - -  01/20/19 0851 135/66 (!) 97.4 F (36.3 C) - 72 12 100 % - -    @flow {1959:LAST@   Intake/Output from previous day:   03/16 0701 - 03/17 0700 In: 4293.9 [P.O.:840; I.V.:3053.8] Out: 3200 [Urine:3150]   Intake/Output this shift:   03/16 1901 - 03/17 0700 In: 1380 [P.O.:480; I.V.:900] Out: 1000 [Urine:1000]   Intake/Output      03/16 0701 - 03/17 0700   P.O. 840   I.V. (mL/kg) 3053.8 (45.7)   IV Piggyback 400.2   Total Intake(mL/kg) 4293.9 (64.3)   Urine (mL/kg/hr) 3150 (2)   Stool 0   Blood 50   Total Output 3200   Net +1093.9          LABORATORY DATA: Recent Labs    01/21/19 0505  WBC 10.0   HGB 9.8*  HCT 31.1*  PLT 173   Recent Labs    01/21/19 0505  NA 141  K 4.5  CL 111  CO2 26  BUN 14  CREATININE 0.59  GLUCOSE 118*  CALCIUM 8.5*   No results found for: INR, PROTIME  Examination:  General appearance: alert, cooperative and no distress Extremities: extremities normal, atraumatic, no cyanosis or edema  Wound Exam: clean, dry, intact   Drainage:  None: wound tissue dry  Motor Exam: Quadriceps and Hamstrings Intact  Sensory Exam: Superficial Peroneal, Deep Peroneal and Tibial normal   Assessment:    1 Day Post-Op  Procedure(s) (LRB): TOTAL KNEE ARTHROPLASTY (Right)  ADDITIONAL DIAGNOSIS:  Active Problems:   S/P total knee replacement     Plan: Physical Therapy as  ordered Weight Bearing as Tolerated (WBAT)  DVT Prophylaxis:  Aspirin  DISCHARGE PLAN: Home  DISCHARGE NEEDS: HHPT       Patient's anticipated LOS is less than 2 midnights, meeting these requirements: - Younger than 41 - Lives within 1 hour of care - Has a competent adult at home to recover with post-op recover - NO history of  - Chronic pain requiring opiods  - Diabetes  - Coronary Artery Disease  - Heart failure  - Heart attack  - Stroke  - DVT/VTE  - Cardiac arrhythmia  - Respiratory Failure/COPD  - Renal failure  - Anemia  - Advanced Liver disease        Donia Ast 01/21/2019, 6:42 AM

## 2019-01-21 NOTE — Progress Notes (Signed)
Physical Therapy Treatment Patient Details Name: Linda Rose MRN: [id number] DOB: 02-05-1951 Today's Date: 01/21/2019    History of Present Illness 68 yo female s/p R TKA 01/20/19.     PT Comments    Progressing with mobility. Pt is still having issues with buckling of R knee. Cues for safety/precautions during gait and stair training. Will plan to have a 2nd session so pt's sister can practice stair negotiation prior to d/c. Issued HEP for pt to perform 2x/day until she begins OP PT.    Follow Up Recommendations  Follow surgeon's recommendation for DC plan and follow-up therapies     Equipment Recommendations  None recommended by PT    Recommendations for Other Services       Precautions / Restrictions Precautions Precautions: Fall Restrictions Weight Bearing Restrictions: No Other Position/Activity Restrictions: WBAT    Mobility  Bed Mobility Overal bed mobility: Needs Assistance Bed Mobility: Supine to Sit     Supine to sit: Supervision     General bed mobility comments: for safety  Transfers Overall transfer level: Needs assistance Equipment used: Rolling walker (2 wheeled) Transfers: Sit to/from Stand Sit to Stand: Min guard         General transfer comment: VCs safety, technique, hand/LE placement. Close guard for safety.   Ambulation/Gait Ambulation/Gait assistance: Min assist Gait Distance (Feet): 75 Feet Assistive device: Rolling walker (2 wheeled) Gait Pattern/deviations: Step-to pattern     General Gait Details: VCs safety, technique, sequence. Cues also for pt to tighten quad muscle/"lock knee out" during stance phase of gait. Intermittent assist 2* knee buckling.    Stairs Stairs: Yes Stairs assistance: Min assist;+2 safety/equipment Stair Management: Backwards;With walker Number of Stairs: 4 General stair comments: Attempted x 3. Up and down portable steps x 2. Practiced x1 2 rails, x 1 (attempted with 1 rail), x 1 backwards with RW.  Much safer with RW backwards technique. Cues for safety, technique, sequence.    Wheelchair Mobility    Modified Rankin (Stroke Patients Only)       Balance Overall balance assessment: Needs assistance         Standing balance support: Bilateral upper extremity supported Standing balance-Leahy Scale: Poor                              Cognition Arousal/Alertness: Awake/alert Behavior During Therapy: WFL for tasks assessed/performed Overall Cognitive Status: Within Functional Limits for tasks assessed                                        Exercises Total Joint Exercises Ankle Circles/Pumps: AROM;Both;10 reps;Supine Quad Sets: AROM;Both;10 reps;Supine Short Arc Quad: AAROM;Right;10 reps;Seated Hip ABduction/ADduction: AROM;Right;10 reps;Supine Straight Leg Raises: AAROM;AROM;Right;10 reps;Supine Long Arc Quad: AAROM;Right;5 reps;Seated Goniometric ROM: ~10-60 degrees    General Comments        Pertinent Vitals/Pain Pain Assessment: 0-10 Pain Score: 6  Pain Location: R knee/LE Pain Descriptors / Indicators: Sore;Aching Pain Intervention(s): Monitored during session;Repositioned;Ice applied    Home Living                      Prior Function            PT Goals (current goals can now be found in the care plan section) Progress towards PT goals: Progressing toward goals    Frequency  7X/week      PT Plan Current plan remains appropriate    Co-evaluation              AM-PAC PT "6 Clicks" Mobility   Outcome Measure  Help needed turning from your back to your side while in a flat bed without using bedrails?: A Little Help needed moving from lying on your back to sitting on the side of a flat bed without using bedrails?: A Little Help needed moving to and from a bed to a chair (including a wheelchair)?: A Little Help needed standing up from a chair using your arms (e.g., wheelchair or bedside chair)?: A  Little Help needed to walk in hospital room?: A Little Help needed climbing 3-5 steps with a railing? : A Little 6 Click Score: 18    End of Session Equipment Utilized During Treatment: Gait belt Activity Tolerance: Patient tolerated treatment well Patient left: in chair;with call bell/phone within reach   PT Visit Diagnosis: Unsteadiness on feet (R26.81);Other abnormalities of gait and mobility (R26.89)     Time: 2951-8841 PT Time Calculation (min) (ACUTE ONLY): 23 min  Charges:  $Gait Training: 8-22 mins $Therapeutic Exercise: 8-22 mins                        Weston Anna, PT Acute Rehabilitation Services Pager: [phone] Office: [phone]

## 2019-01-21 NOTE — Discharge Summary (Signed)
[facility]   Lara Mulch, MD   Carlyon Shadow, PA-C [address], [place], [place]  [address]                             [phone]  PATIENT ID: Linda Rose        MRN:  [id number]          DOB/AGE: 66/08/1951 / 66 y.o.    DISCHARGE SUMMARY  ADMISSION DATE:    01/20/2019 DISCHARGE DATE:   01/21/2019   ADMISSION DIAGNOSIS: RT. KNEE OSTEOARTHRITIS    DISCHARGE DIAGNOSIS:  RT. KNEE OSTEOARTHRITIS    ADDITIONAL DIAGNOSIS: Active Problems:   S/P total knee replacement  Past Medical History:  Diagnosis Date  . GERD (gastroesophageal reflux disease)   . Hyperlipidemia   . Migraines   . Thyroid disease     PROCEDURE: Procedure(s): TOTAL KNEE ARTHROPLASTY on 01/20/2019  CONSULTS:    HISTORY:  See H&P in chart  HOSPITAL COURSE:  Linda Rose is a 68 y.o. admitted on 01/20/2019 and found to have a diagnosis of RT. KNEE OSTEOARTHRITIS.  After appropriate laboratory studies were obtained  they were taken to the operating room on 01/20/2019 and underwent Procedure(s): TOTAL KNEE ARTHROPLASTY.   They were given perioperative antibiotics:  Anti-infectives (From admission, onward)   Start     Dose/Rate Route Frequency Ordered Stop   01/20/19 1300  ceFAZolin (ANCEF) IVPB 2g/100 mL premix     2 g 200 mL/hr over 30 Minutes Intravenous Every 6 hours 01/20/19 1024 01/20/19 2041   01/20/19 0600  ceFAZolin (ANCEF) IVPB 2g/100 mL premix     2 g 200 mL/hr over 30 Minutes Intravenous On call to O.R. 01/20/19 0534 01/20/19 0730   01/20/19 0543  ceFAZolin (ANCEF) 2-4 GM/100ML-% IVPB    Note to Pharmacy:  Charmayne Sheer   : cabinet override      01/20/19 0543 01/20/19 0730    .  Patient given tranexamic acid IV or topical and exparel intra-operatively.  Tolerated the procedure well.    POD# 1: Vital signs were stable.  Patient denied Chest pain, shortness of breath, or calf pain.  Patient was started on Aspirin twice daily at 8am.  Consults to PT, OT,  and care management were made.  The patient was weight bearing as tolerated.  CPM was placed on the operative leg 0-90 degrees for 6-8 hours a day. When out of the CPM, patient was placed in the foam block to achieve full extension. Incentive spirometry was taught.  Dressing was changed.       POD #2, Continued  PT for ambulation and exercise program.  IV saline locked.  O2 discontinued.    The remainder of the hospital course was dedicated to ambulation and strengthening.   The patient was discharged on 1 Day Post-Op in  Good condition.  Blood products given:none  DIAGNOSTIC STUDIES: Recent vital signs:  Patient Vitals for the past 24 hrs:  BP Temp Temp src Pulse Resp SpO2 Height Weight  01/21/19 0537 (!) 106/56 98.2 F (36.8 C) Oral 61 17 99 % - -  01/21/19 0213 (!) 100/53 97.9 F (36.6 C) Oral (!) 55 17 100 % - -  01/20/19 2203 (!) 116/58 (!) 97.2 F (36.2 C) Axillary (!) 57 17 100 % - -  01/20/19 1753 (!) 120/55 (!) 97.5 F (36.4 C) - 61 - 99 % - -  01/20/19  1311 (!) 123/58 97.7 F (36.5 C) Oral 67 16 100 % - -  01/20/19 1212 123/70 (!) 97.5 F (36.4 C) Oral 72 16 99 % - -  01/20/19 1113 134/82 97.7 F (36.5 C) Oral 75 16 100 % - -  01/20/19 1017 130/67 - - 70 16 100 % 5\' 1"  (1.549 m) 66.8 kg  01/20/19 1000 129/62 - - 61 11 100 % - -  01/20/19 0945 123/70 - - 75 13 100 % - -  01/20/19 0930 134/77 - - 73 14 100 % - -  01/20/19 0915 121/67 - - 63 14 100 % - -  01/20/19 0900 131/74 - - 61 12 100 % - -  01/20/19 0851 135/66 (!) 97.4 F (36.3 C) - 72 12 100 % - -       Recent laboratory studies: Recent Labs    01/21/19 0505  WBC 10.0  HGB 9.8*  HCT 31.1*  PLT 173   Recent Labs    01/21/19 0505  NA 141  K 4.5  CL 111  CO2 26  BUN 14  CREATININE 0.59  GLUCOSE 118*  CALCIUM 8.5*   No results found for: INR, PROTIME   Recent Radiographic Studies :  No results found.  DISCHARGE INSTRUCTIONS: Discharge Instructions    Call MD / Call 911   Complete by:  As  directed    If you experience chest pain or shortness of breath, CALL 911 and be transported to the hospital emergency room.  If you develope a fever above 101 F, pus (white drainage) or increased drainage or redness at the wound, or calf pain, call your surgeon's office.   Constipation Prevention   Complete by:  As directed    Drink plenty of fluids.  Prune juice may be helpful.  You may use a stool softener, such as Colace (over the counter) 100 mg twice a day.  Use MiraLax (over the counter) for constipation as needed.   Diet - low sodium heart healthy   Complete by:  As directed    Discharge instructions   Complete by:  As directed    INSTRUCTIONS AFTER JOINT REPLACEMENT   Remove items at home which could result in a fall. This includes throw rugs or furniture in walking pathways ICE to the affected joint every three hours while awake for 30 minutes at a time, for at least the first 3-5 days, and then as needed for pain and swelling.  Continue to use ice for pain and swelling. You may notice swelling that will progress down to the foot and ankle.  This is normal after surgery.  Elevate your leg when you are not up walking on it.   Continue to use the breathing machine you got in the hospital (incentive spirometer) which will help keep your temperature down.  It is common for your temperature to cycle up and down following surgery, especially at night when you are not up moving around and exerting yourself.  The breathing machine keeps your lungs expanded and your temperature down.   DIET:  As you were doing prior to hospitalization, we recommend a well-balanced diet.  DRESSING / WOUND CARE / SHOWERING  Keep the surgical dressing until follow up.  The dressing is water proof, so you can shower without any extra covering.  IF THE DRESSING FALLS OFF or the wound gets wet inside, change the dressing with sterile gauze.  Please use good hand washing techniques before changing the dressing.  Do  not use any lotions or creams on the incision until instructed by your surgeon.    ACTIVITY  Increase activity slowly as tolerated, but follow the weight bearing instructions below.   No driving for 6 weeks or until further direction given by your physician.  You cannot drive while taking narcotics.  No lifting or carrying greater than 10 lbs. until further directed by your surgeon. Avoid periods of inactivity such as sitting longer than an hour when not asleep. This helps prevent blood clots.  You may return to work once you are authorized by your doctor.     WEIGHT BEARING   Weight bearing as tolerated with assist device (walker, cane, etc) as directed, use it as long as suggested by your surgeon or therapist, typically at least 4-6 weeks.   EXERCISES  Results after joint replacement surgery are often greatly improved when you follow the exercise, range of motion and muscle strengthening exercises prescribed by your doctor. Safety measures are also important to protect the joint from further injury. Any time any of these exercises cause you to have increased pain or swelling, decrease what you are doing until you are comfortable again and then slowly increase them. If you have problems or questions, call your caregiver or physical therapist for advice.   Rehabilitation is important following a joint replacement. After just a few days of immobilization, the muscles of the leg can become weakened and shrink (atrophy).  These exercises are designed to build up the tone and strength of the thigh and leg muscles and to improve motion. Often times heat used for twenty to thirty minutes before working out will loosen up your tissues and help with improving the range of motion but do not use heat for the first two weeks following surgery (sometimes heat can increase post-operative swelling).   These exercises can be done on a training (exercise) mat, on the floor, on a table or on a bed. Use  whatever works the best and is most comfortable for you.    Use music or television while you are exercising so that the exercises are a pleasant break in your day. This will make your life better with the exercises acting as a break in your routine that you can look forward to.   Perform all exercises about fifteen times, three times per day or as directed.  You should exercise both the operative leg and the other leg as well.   Exercises include:   Quad Sets - Tighten up the muscle on the front of the thigh (Quad) and hold for 5-10 seconds.   Straight Leg Raises - With your knee straight (if you were given a brace, keep it on), lift the leg to 60 degrees, hold for 3 seconds, and slowly lower the leg.  Perform this exercise against resistance later as your leg gets stronger.  Leg Slides: Lying on your back, slowly slide your foot toward your buttocks, bending your knee up off the floor (only go as far as is comfortable). Then slowly slide your foot back down until your leg is flat on the floor again.  Angel Wings: Lying on your back spread your legs to the side as far apart as you can without causing discomfort.  Hamstring Strength:  Lying on your back, push your heel against the floor with your leg straight by tightening up the muscles of your buttocks.  Repeat, but this time bend your knee to a comfortable angle, and push your heel  against the floor.  You may put a pillow under the heel to make it more comfortable if necessary.   A rehabilitation program following joint replacement surgery can speed recovery and prevent re-injury in the future due to weakened muscles. Contact your doctor or a physical therapist for more information on knee rehabilitation.    CONSTIPATION  Constipation is defined medically as fewer than three stools per week and severe constipation as less than one stool per week.  Even if you have a regular bowel pattern at home, your normal regimen is likely to be disrupted due to  multiple reasons following surgery.  Combination of anesthesia, postoperative narcotics, change in appetite and fluid intake all can affect your bowels.   YOU MUST use at least one of the following options; they are listed in order of increasing strength to get the job done.  They are all available over the counter, and you may need to use some, POSSIBLY even all of these options:    Drink plenty of fluids (prune juice may be helpful) and high fiber foods Colace 100 mg by mouth twice a day  Senokot for constipation as directed and as needed Dulcolax (bisacodyl), take with full glass of water  Miralax (polyethylene glycol) once or twice a day as needed.  If you have tried all these things and are unable to have a bowel movement in the first 3-4 days after surgery call either your surgeon or your primary doctor.    If you experience loose stools or diarrhea, hold the medications until you stool forms back up.  If your symptoms do not get better within 1 week or if they get worse, check with your doctor.  If you experience "the worst abdominal pain ever" or develop nausea or vomiting, please contact the office immediately for further recommendations for treatment.   ITCHING:  If you experience itching with your medications, try taking only a single pain pill, or even half a pain pill at a time.  You can also use Benadryl over the counter for itching or also to help with sleep.   TED HOSE STOCKINGS:  Use stockings on both legs until for at least 2 weeks or as directed by physician office. They may be removed at night for sleeping.  MEDICATIONS:  See your medication summary on the "After Visit Summary" that nursing will review with you.  You may have some home medications which will be placed on hold until you complete the course of blood thinner medication.  It is important for you to complete the blood thinner medication as prescribed.  PRECAUTIONS:  If you experience chest pain or shortness of  breath - call 911 immediately for transfer to the hospital emergency department.   If you develop a fever greater that 101 F, purulent drainage from wound, increased redness or drainage from wound, foul odor from the wound/dressing, or calf pain - CONTACT YOUR SURGEON.                                                   FOLLOW-UP APPOINTMENTS:  If you do not already have a post-op appointment, please call the office for an appointment to be seen by your surgeon.  Guidelines for how soon to be seen are listed in your "After Visit Summary", but are typically between 1-4 weeks after surgery.  OTHER INSTRUCTIONS:   Knee Replacement:  Do not place pillow under knee, focus on keeping the knee straight while resting. CPM instructions: 0-90 degrees, 2 hours in the morning, 2 hours in the afternoon, and 2 hours in the evening. Place foam block, curve side up under heel at all times except when in CPM or when walking.  DO NOT modify, tear, cut, or change the foam block in any way.  MAKE SURE YOU:  Understand these instructions.  Get help right away if you are not doing well or get worse.    Thank you for letting us be a part of your medical care team.  It is a privilege we respect greatly.  We hope these instructions will help you stay on track for a fast and full recovery!   Increase activity slowly as tolerated   Complete by:  As directed       DISCHARGE MEDICATIONS:   Allergies as of 01/21/2019      Reactions   Metrizamide Rash   Iodinated Diagnostic Agents Rash      Medication List    STOP taking these medications   meloxicam 15 MG tablet Commonly known as:  MOBIC     TAKE these medications   acetaminophen 650 MG CR tablet Commonly known as:  TYLENOL Take 650 mg by mouth daily as needed for pain.   ALPRAZolam 0.25 MG tablet Commonly known as:  XANAX Take 0.25-0.5 mg by mouth at bedtime.   aspirin 325 MG EC tablet Take 1 tablet (325 mg total) by mouth 2 (two) times daily. What  changed:    medication strength  how much to take  when to take this  additional instructions   atorvastatin 10 MG tablet Commonly known as:  LIPITOR Take 10 mg by mouth at bedtime.   Biotin 5000 MCG Caps Take 5,000 mcg by mouth daily.   Cal-Mag-Zinc-D Tabs Take 1 tablet by mouth 2 (two) times daily.   cetirizine 10 MG tablet Commonly known as:  ZYRTEC Take 10 mg by mouth daily as needed for allergies.   diclofenac sodium 1 % Gel Commonly known as:  VOLTAREN Apply 2 g topically 4 (four) times daily. Rub into affected area of foot 2 to 4 times daily What changed:    when to take this  reasons to take this  additional instructions   docusate sodium 100 MG capsule Commonly known as:  COLACE Take 100 mg by mouth daily as needed for mild constipation.   Dymista 137-50 MCG/ACT Susp Generic drug:  Azelastine-Fluticasone Place 1 spray into the nose daily as needed (allergies).   estradiol 0.5 MG tablet Commonly known as:  ESTRACE Take 0.5 mg by mouth at bedtime.   Imitrex 100 MG tablet Generic drug:  SUMAtriptan Take 50 mg by mouth every 2 (two) hours as needed for migraine.   levothyroxine 112 MCG tablet Commonly known as:  SYNTHROID, LEVOTHROID Take 112 mcg by mouth daily.   methocarbamol 500 MG tablet Commonly known as:  ROBAXIN Take 1-2 tablets (500-1,000 mg total) by mouth every 6 (six) hours as needed for muscle spasms.   neomycin-bacitracin-polymyxin ointment Commonly known as:  NEOSPORIN Apply 1 application topically as needed for wound care.   omeprazole 40 MG capsule Commonly known as:  PRILOSEC Take 40 mg by mouth daily as needed (acid reflux).   oxyCODONE 5 MG immediate release tablet Commonly known as:  Oxy IR/ROXICODONE Take 1-2 tablets (5-10 mg total) by mouth every 6 (six) hours as  needed for moderate pain (pain score 4-6).   Pataday 0.2 % Soln Generic drug:  Olopatadine HCl Place 1 drop into both eyes daily as needed (allergies).    PreserVision AREDS 2 Caps Take 1 capsule by mouth 2 (two) times daily.            Durable Medical Equipment  (From admission, onward)         Start     Ordered   01/20/19 1024  DME Walker rolling  Once    Question:  Patient needs a walker to treat with the following condition  Answer:  S/P total knee replacement   01/20/19 1024   01/20/19 1024  DME 3 n 1  Once     01/20/19 1024   01/20/19 1024  DME Bedside commode  Once    Question:  Patient needs a bedside commode to treat with the following condition  Answer:  S/P total knee replacement   01/20/19 1024          FOLLOW UP VISIT:    DISPOSITION: HOME VS. SNF  CONDITION:  Good   Donia Ast 01/21/2019, 6:46 AM

## 2019-01-21 NOTE — Progress Notes (Signed)
Physical Therapy Treatment Patient Details Name: Linda Rose MRN: [id number] DOB: 07/04/1951 Today's Date: 01/21/2019    History of Present Illness 68 yo female s/p R TKA 01/20/19.     PT Comments    2nd session to practice stair negotiation with sister present. Pt is still having issues with R knee buckling so I issued a KI for pt to use only for stair negotiation (for safety reasons), temporarily, until buckling stops. All education completed-made RN aware.    Follow Up Recommendations  Follow surgeon's recommendation for DC plan and follow-up therapies     Equipment Recommendations  None recommended by PT    Recommendations for Other Services       Precautions / Restrictions Precautions Precautions: Fall Restrictions Weight Bearing Restrictions: No Other Position/Activity Restrictions: WBAT    Mobility  Bed Mobility Overal bed mobility: Needs Assistance Bed Mobility: Supine to Sit     Supine to sit: Supervision     General bed mobility comments: oob in recliner  Transfers Overall transfer level: Needs assistance Equipment used: Rolling walker (2 wheeled) Transfers: Sit to/from Stand Sit to Stand: Supervision         General transfer comment: VCs safety, technique, hand/LE placement.  Ambulation/Gait Ambulation/Gait assistance: Min guard Gait Distance (Feet): 50 Feet Assistive device: Rolling walker (2 wheeled) Gait Pattern/deviations: Step-to pattern     General Gait Details: Close guard for safety. Cues for pt to exercise caution and move slowly 2* intermittent knee buckling.    Stairs Stairs: Yes Stairs assistance: Min assist Stair Management: Backwards;With walker; Forwards; With 2 rails Number of Stairs: 7 General stair comments: up and over portable stairs x 3. Used KI for safety reasons/fall risk. Pt was able to successfully negotiate stairs all 3 times without buckling. Sister present to observe and assist with managing the RW. Practiced  x2 with 2 handrails and x 1 with RW backwards.    Wheelchair Mobility    Modified Rankin (Stroke Patients Only)       Balance Overall balance assessment: Needs assistance         Standing balance support: Bilateral upper extremity supported Standing balance-Leahy Scale: Poor                              Cognition Arousal/Alertness: Awake/alert Behavior During Therapy: WFL for tasks assessed/performed Overall Cognitive Status: Within Functional Limits for tasks assessed                                        Exercises    General Comments        Pertinent Vitals/Pain Pain Assessment: 0-10 Pain Score: 5  Pain Location: R knee/LE Pain Descriptors / Indicators: Sore;Aching Pain Intervention(s): Monitored during session;Repositioned    Home Living                      Prior Function            PT Goals (current goals can now be found in the care plan section) Progress towards PT goals: Progressing toward goals    Frequency    7X/week      PT Plan Current plan remains appropriate    Co-evaluation              AM-PAC PT "6 Clicks" Mobility   Outcome Measure  Help  needed turning from your back to your side while in a flat bed without using bedrails?: A Little Help needed moving from lying on your back to sitting on the side of a flat bed without using bedrails?: A Little Help needed moving to and from a bed to a chair (including a wheelchair)?: A Little Help needed standing up from a chair using your arms (e.g., wheelchair or bedside chair)?: A Little Help needed to walk in hospital room?: A Little Help needed climbing 3-5 steps with a railing? : A Little 6 Click Score: 18    End of Session Equipment Utilized During Treatment: Gait belt Activity Tolerance: Patient tolerated treatment well Patient left: in chair;with call bell/phone within reach;with family/visitor present   PT Visit Diagnosis: Unsteadiness  on feet (R26.81);Other abnormalities of gait and mobility (R26.89)     Time: 2376-2831 PT Time Calculation (min) (ACUTE ONLY): 34 min  Charges:  $Gait Training: 23-37 mins $Therapeutic Exercise: 8-22 mins                        Weston Anna, PT Acute Rehabilitation Services Pager: [phone] Office: [phone]

## 2019-01-21 NOTE — Progress Notes (Signed)
RN reviewed discharge instructions with patient and family. All questions answered.   Paperwork and prescriptions given.   NT rolled patient down with all belongings to family car. 

## 2019-01-21 NOTE — Care Management Obs Status (Signed)
MEDICARE OBSERVATION STATUS NOTIFICATION   Patient Details  Name: Linda Rose MRN: [id number] Date of Birth: Apr 30, 1951   Medicare Observation Status Notification Given:  Yes    Leeroy Cha, RN 01/21/2019, 10:00 AM

## 2019-01-22 ENCOUNTER — Encounter (HOSPITAL_COMMUNITY): Payer: Self-pay | Admitting: Orthopedic Surgery

## 2019-03-21 ENCOUNTER — Other Ambulatory Visit: Payer: Self-pay | Admitting: Internal Medicine

## 2019-03-24 ENCOUNTER — Other Ambulatory Visit: Payer: Self-pay | Admitting: Internal Medicine

## 2019-03-24 DIAGNOSIS — Z1231 Encounter for screening mammogram for malignant neoplasm of breast: Secondary | ICD-10-CM

## 2019-04-09 ENCOUNTER — Ambulatory Visit: Payer: Medicare Other

## 2019-04-17 ENCOUNTER — Other Ambulatory Visit: Payer: Self-pay

## 2019-04-17 ENCOUNTER — Ambulatory Visit
Admission: RE | Admit: 2019-04-17 | Discharge: 2019-04-17 | Disposition: A | Discharge status: Home / Self Care | Payer: Medicare Other | Source: Ambulatory Visit | Attending: Internal Medicine | Admitting: Internal Medicine

## 2019-04-17 DIAGNOSIS — Z1231 Encounter for screening mammogram for malignant neoplasm of breast: Secondary | ICD-10-CM

## 2019-05-07 DIAGNOSIS — M79642 Pain in left hand: Secondary | ICD-10-CM | POA: Insufficient documentation

## 2019-10-08 DIAGNOSIS — F411 Generalized anxiety disorder: Secondary | ICD-10-CM | POA: Insufficient documentation

## 2020-04-07 ENCOUNTER — Other Ambulatory Visit: Payer: Self-pay | Admitting: Internal Medicine

## 2020-04-07 DIAGNOSIS — Z1231 Encounter for screening mammogram for malignant neoplasm of breast: Secondary | ICD-10-CM

## 2020-05-17 ENCOUNTER — Ambulatory Visit
Admission: RE | Admit: 2020-05-17 | Discharge: 2020-05-17 | Disposition: A | Discharge status: Home / Self Care | Payer: Medicare Other | Source: Ambulatory Visit | Attending: Internal Medicine | Admitting: Internal Medicine

## 2020-05-17 DIAGNOSIS — Z1231 Encounter for screening mammogram for malignant neoplasm of breast: Secondary | ICD-10-CM | POA: Insufficient documentation

## 2020-06-22 ENCOUNTER — Ambulatory Visit (INDEPENDENT_AMBULATORY_CARE_PROVIDER_SITE_OTHER): Payer: Medicare Other | Admitting: Gastroenterology

## 2020-06-22 ENCOUNTER — Other Ambulatory Visit: Payer: Self-pay

## 2020-06-22 ENCOUNTER — Encounter: Payer: Self-pay | Admitting: Gastroenterology

## 2020-06-22 VITALS — BP 143/81 | HR 65 | Temp 98.1°F | Ht 61.0 in | Wt 138.2 lb

## 2020-06-22 DIAGNOSIS — R1310 Dysphagia, unspecified: Secondary | ICD-10-CM | POA: Diagnosis not present

## 2020-06-22 DIAGNOSIS — Z9109 Other allergy status, other than to drugs and biological substances: Secondary | ICD-10-CM | POA: Insufficient documentation

## 2020-06-22 DIAGNOSIS — Z8669 Personal history of other diseases of the nervous system and sense organs: Secondary | ICD-10-CM | POA: Insufficient documentation

## 2020-06-22 DIAGNOSIS — Z8659 Personal history of other mental and behavioral disorders: Secondary | ICD-10-CM | POA: Insufficient documentation

## 2020-06-22 DIAGNOSIS — Z720 Tobacco use: Secondary | ICD-10-CM | POA: Insufficient documentation

## 2020-06-22 DIAGNOSIS — E039 Hypothyroidism, unspecified: Secondary | ICD-10-CM | POA: Insufficient documentation

## 2020-06-22 DIAGNOSIS — M1990 Unspecified osteoarthritis, unspecified site: Secondary | ICD-10-CM | POA: Insufficient documentation

## 2020-06-22 DIAGNOSIS — R1319 Other dysphagia: Secondary | ICD-10-CM

## 2020-06-22 DIAGNOSIS — Z8742 Personal history of other diseases of the female genital tract: Secondary | ICD-10-CM | POA: Insufficient documentation

## 2020-06-22 NOTE — H&P (View-Only) (Signed)
Gastroenterology Consultation  Referring Provider:     Idelle Crouch, MD Primary Care Physician:  Idelle Crouch, MD Primary Gastroenterologist:  Dr. Allen Norris     Reason for Consultation:     Dysphagia and history of colon polyps.        HPI:   Linda Rose is a 69 y.o. y/o female referred for consultation & management of dysphagia and history of polyps by Dr. Doy Hutching, Leonie Douglas, MD.  This patient comes to me after being seen by Dr. Vira Agar for some time for colon polyps.  The patient reports that her last colonoscopy was approximately 5 years ago.  She believes she had it at [facility] and not on the [place].  The patient states that she has been doing well except that she has been having some problems with dysphagia.  She states that it is mostly with meloxicam.  She does not have many issues with most foods but does report that possibly pork grinds that she was eating also caused her to have problems.  The patient does not have any unexplained weight loss but states that she has been losing weight on a keto diet.  She does not recall having any problems with swallowing other bulky foods.  There is no report of any abdominal pain associated with any of this.  She also denies that food has gotten stuck whereupon she had needed to vomit to get the food out.  Past Medical History:  Diagnosis Date  . GERD (gastroesophageal reflux disease)   . Hyperlipidemia   . Migraines   . Thyroid disease     Past Surgical History:  Procedure Laterality Date  . ABDOMINAL HYSTERECTOMY    . FRACTURE SURGERY     Tibulala and Fibula   . SPINAL FUSION    . TOTAL KNEE ARTHROPLASTY Right 01/20/2019   Procedure: TOTAL KNEE ARTHROPLASTY;  Surgeon: Vickey Huger, MD;  Location: [facility];  Service: Orthopedics;  Laterality: Right;  . TRIGGER FINGER RELEASE Bilateral 10/2017   x 4     Prior to Admission medications   Medication Sig Start Date End Date Taking? Authorizing Provider    acetaminophen (TYLENOL) 650 MG CR tablet Take 650 mg by mouth daily as needed for pain.    [provider]  ALPRAZolam Duanne Moron) 0.25 MG tablet Take 0.25-0.5 mg by mouth at bedtime.  03/29/15   [provider]  aspirin EC 325 MG EC tablet Take 1 tablet (325 mg total) by mouth 2 (two) times daily. 01/21/19   Donia Ast, PA  atorvastatin (LIPITOR) 10 MG tablet Take 10 mg by mouth at bedtime.  09/07/16   [provider]  Azelastine HCl 0.15 % SOLN SMARTSIG:2 Puff(s) Both Nares Daily 01/19/20   [provider]  Azelastine-Fluticasone (DYMISTA) 137-50 MCG/ACT SUSP Place 1 spray into the nose daily as needed (allergies).    [provider]  Biotin 5000 MCG CAPS Take 5,000 mcg by mouth daily.    [provider]  cetirizine (ZYRTEC) 10 MG tablet Take 10 mg by mouth daily as needed for allergies.    [provider]  chlorhexidine (PERIDEX) 0.12 % solution chlorhexidine gluconate 0.12 % mouthwash    [provider]  diclofenac sodium (VOLTAREN) 1 % GEL Apply 2 g topically 4 (four) times daily. Rub into affected area of foot 2 to 4 times daily Patient taking differently: Apply 2 g topically 4 (four) times daily as needed (pain).  07/20/15  Trula Slade, DPM  docusate sodium (COLACE) 100 MG capsule Take 100 mg by mouth daily as needed for mild constipation.    [provider]  estradiol (ESTRACE) 0.5 MG tablet Take 0.5 mg by mouth at bedtime.  03/10/13   [provider]  ibuprofen (ADVIL) 600 MG tablet ibuprofen 600 mg tablet    [provider]  levothyroxine (SYNTHROID, LEVOTHROID) 112 MCG tablet Take 112 mcg by mouth daily.     [provider]  meloxicam (MOBIC) 15 MG tablet Take 15 mg by mouth daily. 04/29/20   [provider]  methocarbamol (ROBAXIN) 500 MG tablet Take 1-2 tablets (500-1,000 mg total) by mouth every 6 (six) hours as needed for muscle spasms. 01/21/19   Donia Ast, PA  Multiple Minerals-Vitamins (CAL-MAG-ZINC-D) TABS Take 1 tablet by mouth 2 (two) times daily.    [provider]  Multiple Vitamins-Minerals (PRESERVISION AREDS 2) CAPS Take 1 capsule by mouth 2 (two) times daily.    [provider]  neomycin-bacitracin-polymyxin (NEOSPORIN) ointment Apply 1 application topically as needed for wound care.    [provider]  Olopatadine HCl (PATADAY) 0.2 % SOLN Place 1 drop into both eyes daily as needed (allergies).    [provider]  omeprazole (PRILOSEC) 40 MG capsule Take 40 mg by mouth daily as needed (acid reflux).  02/26/13   [provider]  oxyCODONE (OXY IR/ROXICODONE) 5 MG immediate release tablet Take 1-2 tablets (5-10 mg total) by mouth every 6 (six) hours as needed for moderate pain (pain score 4-6). 01/21/19   Donia Ast, PA  SUMAtriptan (IMITREX) 100 MG tablet Take 50 mg by mouth every 2 (two) hours as needed for migraine.  04/03/12   [provider]  traMADol (ULTRAM) 50 MG tablet tramadol 50 mg tablet    [provider]    Family History  Problem Relation Age of Onset  . Diabetes Mother   . Hypertension Mother   . Heart failure Mother   . Thyroid disease Father   . Breast cancer Neg Hx      Social History   Tobacco Use  . Smoking status: Former Smoker    Packs/day: 0.50    Years: 20.00    Pack years: 10.00    Types: Cigarettes  . Smokeless tobacco: Never Used  Vaping Use  . Vaping Use: Never used  Substance Use Topics  . Alcohol use: No    Alcohol/week: 0.0 standard drinks  . Drug use: No    Allergies as of 06/22/2020 - Review Complete 01/20/2019  Allergen Reaction Noted  . Iodinated diagnostic agents Rash and Swelling 07/16/2014  . Metrizamide Rash 06/29/2015    Review of Systems:    All systems reviewed and negative except where noted in HPI.   Physical Exam:  LMP  (LMP Unknown)  No LMP recorded (lmp unknown). Patient has had a  hysterectomy. General:   Alert,  Well-developed, well-nourished, pleasant and cooperative in NAD Head:  Normocephalic and atraumatic. Eyes:  Sclera clear, no icterus.   Conjunctiva pink. Ears:  Normal auditory acuity. Neck:  Supple; no masses or thyromegaly. Lungs:  Respirations even and unlabored.  Clear throughout to auscultation.   No wheezes, crackles, or rhonchi. No acute distress. Heart:  Regular rate and rhythm; no murmurs, clicks, rubs, or gallops. Abdomen:  Normal bowel sounds.  No bruits.  Soft, non-tender and non-distended without masses, hepatosplenomegaly or hernias noted.  No guarding or rebound tenderness.  Negative Carnett  sign.   Rectal:  Deferred.  Pulses:  Normal pulses noted. Extremities:  No clubbing or edema.  No cyanosis. Neurologic:  Alert and oriented x3;  grossly normal neurologically. Skin:  Intact without significant lesions or rashes.  No jaundice. Lymph Nodes:  No significant cervical adenopathy. Psych:  Alert and cooperative. Normal mood and affect.  Imaging Studies: No results found.  Assessment and Plan:   Linda Rose is a 69 y.o. y/o female who comes in today with a history of colon polyps and was told that she is in need of a repeat colonoscopy.  The patient has a history of adenomatous polyps with her last colonoscopy she states 5 years ago.  The patient also has dysphagia for which she will be set up for an EGD.  The patient's dysphagia may be due to a stricture versus a motility disorder.  The patient has been explained the plan and agrees with it.    Lucilla Lame, MD. Marval Regal    Note: This dictation was prepared with Dragon dictation along with smaller phrase technology. Any transcriptional errors that result from this process are unintentional.

## 2020-06-22 NOTE — Progress Notes (Signed)
Gastroenterology Consultation  Referring Provider:     Idelle Crouch, MD Primary Care Physician:  Idelle Crouch, MD Primary Gastroenterologist:  Dr. Allen Norris     Reason for Consultation:     Dysphagia and history of colon polyps.        HPI:   Linda Rose is a 69 y.o. y/o female referred for consultation & management of dysphagia and history of polyps by Dr. Doy Hutching, Leonie Douglas, MD.  This patient comes to me after being seen by Dr. Vira Agar for some time for colon polyps.  The patient reports that her last colonoscopy was approximately 5 years ago.  She believes she had it at [facility] and not on the [place].  The patient states that she has been doing well except that she has been having some problems with dysphagia.  She states that it is mostly with meloxicam.  She does not have many issues with most foods but does report that possibly pork grinds that she was eating also caused her to have problems.  The patient does not have any unexplained weight loss but states that she has been losing weight on a keto diet.  She does not recall having any problems with swallowing other bulky foods.  There is no report of any abdominal pain associated with any of this.  She also denies that food has gotten stuck whereupon she had needed to vomit to get the food out.  Past Medical History:  Diagnosis Date  . GERD (gastroesophageal reflux disease)   . Hyperlipidemia   . Migraines   . Thyroid disease     Past Surgical History:  Procedure Laterality Date  . ABDOMINAL HYSTERECTOMY    . FRACTURE SURGERY     Tibulala and Fibula   . SPINAL FUSION    . TOTAL KNEE ARTHROPLASTY Right 01/20/2019   Procedure: TOTAL KNEE ARTHROPLASTY;  Surgeon: Vickey Huger, MD;  Location: [facility];  Service: Orthopedics;  Laterality: Right;  . TRIGGER FINGER RELEASE Bilateral 10/2017   x 4     Prior to Admission medications   Medication Sig Start Date End Date Taking? Authorizing Provider    acetaminophen (TYLENOL) 650 MG CR tablet Take 650 mg by mouth daily as needed for pain.    [provider]  ALPRAZolam Duanne Moron) 0.25 MG tablet Take 0.25-0.5 mg by mouth at bedtime.  03/29/15   [provider]  aspirin EC 325 MG EC tablet Take 1 tablet (325 mg total) by mouth 2 (two) times daily. 01/21/19   Donia Ast, PA  atorvastatin (LIPITOR) 10 MG tablet Take 10 mg by mouth at bedtime.  09/07/16   [provider]  Azelastine HCl 0.15 % SOLN SMARTSIG:2 Puff(s) Both Nares Daily 01/19/20   [provider]  Azelastine-Fluticasone (DYMISTA) 137-50 MCG/ACT SUSP Place 1 spray into the nose daily as needed (allergies).    [provider]  Biotin 5000 MCG CAPS Take 5,000 mcg by mouth daily.    [provider]  cetirizine (ZYRTEC) 10 MG tablet Take 10 mg by mouth daily as needed for allergies.    [provider]  chlorhexidine (PERIDEX) 0.12 % solution chlorhexidine gluconate 0.12 % mouthwash    [provider]  diclofenac sodium (VOLTAREN) 1 % GEL Apply 2 g topically 4 (four) times daily. Rub into affected area of foot 2 to 4 times daily Patient taking differently: Apply 2 g topically 4 (four) times daily as needed (pain).  07/20/15  Trula Slade, DPM  docusate sodium (COLACE) 100 MG capsule Take 100 mg by mouth daily as needed for mild constipation.    [provider]  estradiol (ESTRACE) 0.5 MG tablet Take 0.5 mg by mouth at bedtime.  03/10/13   [provider]  ibuprofen (ADVIL) 600 MG tablet ibuprofen 600 mg tablet    [provider]  levothyroxine (SYNTHROID, LEVOTHROID) 112 MCG tablet Take 112 mcg by mouth daily.     [provider]  meloxicam (MOBIC) 15 MG tablet Take 15 mg by mouth daily. 04/29/20   [provider]  methocarbamol (ROBAXIN) 500 MG tablet Take 1-2 tablets (500-1,000 mg total) by mouth every 6 (six) hours as needed for muscle spasms. 01/21/19   Donia Ast, PA  Multiple Minerals-Vitamins (CAL-MAG-ZINC-D) TABS Take 1 tablet by mouth 2 (two) times daily.    [provider]  Multiple Vitamins-Minerals (PRESERVISION AREDS 2) CAPS Take 1 capsule by mouth 2 (two) times daily.    [provider]  neomycin-bacitracin-polymyxin (NEOSPORIN) ointment Apply 1 application topically as needed for wound care.    [provider]  Olopatadine HCl (PATADAY) 0.2 % SOLN Place 1 drop into both eyes daily as needed (allergies).    [provider]  omeprazole (PRILOSEC) 40 MG capsule Take 40 mg by mouth daily as needed (acid reflux).  02/26/13   [provider]  oxyCODONE (OXY IR/ROXICODONE) 5 MG immediate release tablet Take 1-2 tablets (5-10 mg total) by mouth every 6 (six) hours as needed for moderate pain (pain score 4-6). 01/21/19   Donia Ast, PA  SUMAtriptan (IMITREX) 100 MG tablet Take 50 mg by mouth every 2 (two) hours as needed for migraine.  04/03/12   [provider]  traMADol (ULTRAM) 50 MG tablet tramadol 50 mg tablet    [provider]    Family History  Problem Relation Age of Onset  . Diabetes Mother   . Hypertension Mother   . Heart failure Mother   . Thyroid disease Father   . Breast cancer Neg Hx      Social History   Tobacco Use  . Smoking status: Former Smoker    Packs/day: 0.50    Years: 20.00    Pack years: 10.00    Types: Cigarettes  . Smokeless tobacco: Never Used  Vaping Use  . Vaping Use: Never used  Substance Use Topics  . Alcohol use: No    Alcohol/week: 0.0 standard drinks  . Drug use: No    Allergies as of 06/22/2020 - Review Complete 01/20/2019  Allergen Reaction Noted  . Iodinated diagnostic agents Rash and Swelling 07/16/2014  . Metrizamide Rash 06/29/2015    Review of Systems:    All systems reviewed and negative except where noted in HPI.   Physical Exam:  LMP  (LMP Unknown)  No LMP recorded (lmp unknown). Patient has had a  hysterectomy. General:   Alert,  Well-developed, well-nourished, pleasant and cooperative in NAD Head:  Normocephalic and atraumatic. Eyes:  Sclera clear, no icterus.   Conjunctiva pink. Ears:  Normal auditory acuity. Neck:  Supple; no masses or thyromegaly. Lungs:  Respirations even and unlabored.  Clear throughout to auscultation.   No wheezes, crackles, or rhonchi. No acute distress. Heart:  Regular rate and rhythm; no murmurs, clicks, rubs, or gallops. Abdomen:  Normal bowel sounds.  No bruits.  Soft, non-tender and non-distended without masses, hepatosplenomegaly or hernias noted.  No guarding or rebound tenderness.  Negative Carnett  sign.   Rectal:  Deferred.  Pulses:  Normal pulses noted. Extremities:  No clubbing or edema.  No cyanosis. Neurologic:  Alert and oriented x3;  grossly normal neurologically. Skin:  Intact without significant lesions or rashes.  No jaundice. Lymph Nodes:  No significant cervical adenopathy. Psych:  Alert and cooperative. Normal mood and affect.  Imaging Studies: No results found.  Assessment and Plan:   CHONTEL WARNING is a 69 y.o. y/o female who comes in today with a history of colon polyps and was told that she is in need of a repeat colonoscopy.  The patient has a history of adenomatous polyps with her last colonoscopy she states 5 years ago.  The patient also has dysphagia for which she will be set up for an EGD.  The patient's dysphagia may be due to a stricture versus a motility disorder.  The patient has been explained the plan and agrees with it.    Linda Lame, MD. Marval Regal    Note: This dictation was prepared with Dragon dictation along with smaller phrase technology. Any transcriptional errors that result from this process are unintentional.

## 2020-06-23 ENCOUNTER — Other Ambulatory Visit: Payer: Self-pay

## 2020-06-23 DIAGNOSIS — R1310 Dysphagia, unspecified: Secondary | ICD-10-CM

## 2020-06-23 DIAGNOSIS — R1319 Other dysphagia: Secondary | ICD-10-CM

## 2020-07-02 ENCOUNTER — Other Ambulatory Visit: Payer: Self-pay

## 2020-07-02 ENCOUNTER — Other Ambulatory Visit
Admission: RE | Admit: 2020-07-02 | Discharge: 2020-07-02 | Disposition: A | Discharge status: Home / Self Care | Payer: Medicare Other | Source: Ambulatory Visit | Attending: Gastroenterology | Admitting: Gastroenterology

## 2020-07-02 DIAGNOSIS — Z01812 Encounter for preprocedural laboratory examination: Secondary | ICD-10-CM | POA: Insufficient documentation

## 2020-07-02 DIAGNOSIS — Z20822 Contact with and (suspected) exposure to covid-19: Secondary | ICD-10-CM | POA: Insufficient documentation

## 2020-07-02 LAB — SARS CORONAVIRUS 2 (TAT 6-24 HRS): SARS Coronavirus 2: NEGATIVE

## 2020-07-06 ENCOUNTER — Other Ambulatory Visit: Payer: Self-pay

## 2020-07-06 ENCOUNTER — Ambulatory Visit: Payer: Medicare Other | Admitting: Anesthesiology

## 2020-07-06 ENCOUNTER — Ambulatory Visit
Admission: RE | Admit: 2020-07-06 | Discharge: 2020-07-06 | Disposition: A | Discharge status: Home / Self Care | Payer: Medicare Other | Attending: Gastroenterology | Admitting: Gastroenterology

## 2020-07-06 ENCOUNTER — Encounter: Payer: Self-pay | Admitting: Gastroenterology

## 2020-07-06 ENCOUNTER — Encounter
Admission: RE | Disposition: A | Discharge status: Home / Self Care | Payer: Self-pay | Source: Home / Self Care | Attending: Gastroenterology

## 2020-07-06 DIAGNOSIS — Z860100 Personal history of colon polyps, unspecified: Secondary | ICD-10-CM

## 2020-07-06 DIAGNOSIS — Z79899 Other long term (current) drug therapy: Secondary | ICD-10-CM | POA: Diagnosis not present

## 2020-07-06 DIAGNOSIS — Z791 Long term (current) use of non-steroidal anti-inflammatories (NSAID): Secondary | ICD-10-CM | POA: Diagnosis not present

## 2020-07-06 DIAGNOSIS — K219 Gastro-esophageal reflux disease without esophagitis: Secondary | ICD-10-CM | POA: Insufficient documentation

## 2020-07-06 DIAGNOSIS — F419 Anxiety disorder, unspecified: Secondary | ICD-10-CM | POA: Diagnosis not present

## 2020-07-06 DIAGNOSIS — Z87891 Personal history of nicotine dependence: Secondary | ICD-10-CM | POA: Diagnosis not present

## 2020-07-06 DIAGNOSIS — K449 Diaphragmatic hernia without obstruction or gangrene: Secondary | ICD-10-CM | POA: Diagnosis not present

## 2020-07-06 DIAGNOSIS — E039 Hypothyroidism, unspecified: Secondary | ICD-10-CM | POA: Insufficient documentation

## 2020-07-06 DIAGNOSIS — K2970 Gastritis, unspecified, without bleeding: Secondary | ICD-10-CM | POA: Insufficient documentation

## 2020-07-06 DIAGNOSIS — Z79890 Hormone replacement therapy (postmenopausal): Secondary | ICD-10-CM | POA: Insufficient documentation

## 2020-07-06 DIAGNOSIS — Z8719 Personal history of other diseases of the digestive system: Secondary | ICD-10-CM | POA: Diagnosis not present

## 2020-07-06 DIAGNOSIS — K640 First degree hemorrhoids: Secondary | ICD-10-CM | POA: Insufficient documentation

## 2020-07-06 DIAGNOSIS — R1310 Dysphagia, unspecified: Secondary | ICD-10-CM | POA: Diagnosis not present

## 2020-07-06 DIAGNOSIS — K2900 Acute gastritis without bleeding: Secondary | ICD-10-CM | POA: Diagnosis not present

## 2020-07-06 DIAGNOSIS — Z7982 Long term (current) use of aspirin: Secondary | ICD-10-CM | POA: Diagnosis not present

## 2020-07-06 DIAGNOSIS — Z1211 Encounter for screening for malignant neoplasm of colon: Secondary | ICD-10-CM | POA: Diagnosis not present

## 2020-07-06 DIAGNOSIS — E785 Hyperlipidemia, unspecified: Secondary | ICD-10-CM | POA: Diagnosis not present

## 2020-07-06 DIAGNOSIS — R1319 Other dysphagia: Secondary | ICD-10-CM

## 2020-07-06 DIAGNOSIS — M1990 Unspecified osteoarthritis, unspecified site: Secondary | ICD-10-CM | POA: Insufficient documentation

## 2020-07-06 DIAGNOSIS — I10 Essential (primary) hypertension: Secondary | ICD-10-CM | POA: Insufficient documentation

## 2020-07-06 DIAGNOSIS — Z96651 Presence of right artificial knee joint: Secondary | ICD-10-CM | POA: Diagnosis not present

## 2020-07-06 HISTORY — PX: ESOPHAGOGASTRODUODENOSCOPY (EGD) WITH PROPOFOL: SHX5813

## 2020-07-06 HISTORY — PX: COLONOSCOPY WITH PROPOFOL: SHX5780

## 2020-07-06 HISTORY — DX: Hypothyroidism, unspecified: E03.9

## 2020-07-06 SURGERY — ESOPHAGOGASTRODUODENOSCOPY (EGD) WITH PROPOFOL
Anesthesia: General

## 2020-07-06 MED ORDER — SODIUM CHLORIDE 0.9 % IV SOLN: INTRAVENOUS | Status: DC

## 2020-07-06 MED ORDER — GLYCOPYRROLATE 0.2 MG/ML IJ SOLN
INTRAMUSCULAR | Status: DC | PRN
  Administered 2020-07-06: .2 mg via INTRAVENOUS

## 2020-07-06 MED ORDER — DEXMEDETOMIDINE HCL 200 MCG/2ML IV SOLN
INTRAVENOUS | Status: DC | PRN
  Administered 2020-07-06: 16 ug via INTRAVENOUS

## 2020-07-06 MED ORDER — LIDOCAINE HCL (PF) 2 % IJ SOLN
INTRAMUSCULAR | Status: AC
  Filled 2020-07-06: qty 10

## 2020-07-06 MED ORDER — LIDOCAINE HCL (CARDIAC) PF 100 MG/5ML IV SOSY
PREFILLED_SYRINGE | INTRAVENOUS | Status: DC | PRN
  Administered 2020-07-06: 40 mg via INTRAVENOUS
  Administered 2020-07-06: 60 mg via INTRAVENOUS

## 2020-07-06 MED ORDER — PROPOFOL 500 MG/50ML IV EMUL
INTRAVENOUS | Status: AC
  Filled 2020-07-06: qty 100

## 2020-07-06 MED ORDER — PROPOFOL 500 MG/50ML IV EMUL
INTRAVENOUS | Status: DC | PRN
  Administered 2020-07-06: 175 ug/kg/min via INTRAVENOUS

## 2020-07-06 MED ORDER — PROPOFOL 10 MG/ML IV BOLUS
INTRAVENOUS | Status: DC | PRN
  Administered 2020-07-06: 60 mg via INTRAVENOUS
  Administered 2020-07-06 (×2): 20 mg via INTRAVENOUS

## 2020-07-06 MED ORDER — GLYCOPYRROLATE 0.2 MG/ML IJ SOLN
INTRAMUSCULAR | Status: AC
  Filled 2020-07-06: qty 1

## 2020-07-06 MED ORDER — PHENYLEPHRINE HCL (PRESSORS) 10 MG/ML IV SOLN
INTRAVENOUS | Status: DC | PRN
  Administered 2020-07-06: 100 ug via INTRAVENOUS
  Administered 2020-07-06: 150 ug via INTRAVENOUS

## 2020-07-06 NOTE — Anesthesia Postprocedure Evaluation (Signed)
Anesthesia Post Note  Patient: Linda Rose  Procedure(s) Performed: ESOPHAGOGASTRODUODENOSCOPY (EGD) WITH PROPOFOL (N/A ) COLONOSCOPY WITH PROPOFOL (N/A )  Patient location during evaluation: Endoscopy Anesthesia Type: General Level of consciousness: awake and alert and oriented Pain management: pain level controlled Vital Signs Assessment: post-procedure vital signs reviewed and stable Respiratory status: spontaneous breathing, nonlabored ventilation and respiratory function stable Cardiovascular status: blood pressure returned to baseline and stable Postop Assessment: no signs of nausea or vomiting Anesthetic complications: no   No complications documented.   Last Vitals:  Vitals:   07/06/20 0853 07/06/20 0938  BP: (!) 150/68 114/85  Pulse: 71 (!) 59  Resp: 18 15  Temp: 36.8 C 36.8 C  SpO2: 100% 99%    Last Pain:  Vitals:   07/06/20 0938  TempSrc:   PainSc: 0-No pain                 Endora Teresi

## 2020-07-06 NOTE — Interval H&P Note (Signed)
History and Physical Interval Note:  07/06/2020 8:58 AM  Linda Rose  has presented today for surgery, with the diagnosis of dysphagia R13.10 Hx of colon polyps Z86.010.  The various methods of treatment have been discussed with the patient and family. After consideration of risks, benefits and other options for treatment, the patient has consented to  Procedure(s): ESOPHAGOGASTRODUODENOSCOPY (EGD) WITH PROPOFOL (N/A) COLONOSCOPY WITH PROPOFOL (N/A) as a surgical intervention.  The patient's history has been reviewed, patient examined, no change in status, stable for surgery.  I have reviewed the patient's chart and labs.  Questions were answered to the patient's satisfaction.     Thane Age [employer]

## 2020-07-06 NOTE — Op Note (Addendum)
[facility] Gastroenterology Patient Name: Linda Rose Procedure Date: 07/06/2020 9:08 AM MRN: [id number] Account #: [id number] Date of Birth: 07/12/1951 Admit Type: Outpatient Age: 69 Room: [facility] ENDO ROOM 4 Gender: Female Note Status: Finalized Procedure:             Colonoscopy Indications:           High risk colon cancer surveillance: Personal history                         of colonic polyps Providers:             Lucilla Lame MD, MD Referring MD:          Leonie Douglas. Doy Hutching, MD (Referring MD) Medicines:             Propofol per Anesthesia Complications:         No immediate complications. Procedure:             Pre-Anesthesia Assessment:                        - Prior to the procedure, a History and Physical was                         performed, and patient medications and allergies were                         reviewed. The patient's tolerance of previous                         anesthesia was also reviewed. The risks and benefits                         of the procedure and the sedation options and risks                         were discussed with the patient. All questions were                         answered, and informed consent was obtained. Prior                         Anticoagulants: The patient has taken no previous                         anticoagulant or antiplatelet agents. ASA Grade                         Assessment: II - A patient with mild systemic disease.                         After reviewing the risks and benefits, the patient                         was deemed in satisfactory condition to undergo the                         procedure.  After obtaining informed consent, the colonoscope was                         passed under direct vision. Throughout the procedure,                         the patient's blood pressure, pulse, and oxygen                         saturations were monitored continuously. The                          Colonoscope was introduced through the anus and                         advanced to the the cecum, identified by appendiceal                         orifice and ileocecal valve. The colonoscopy was                         performed without difficulty. The patient tolerated                         the procedure well. The quality of the bowel                         preparation was excellent. Findings:      The perianal and digital rectal examinations were normal.      Non-bleeding internal hemorrhoids were found during retroflexion. The       hemorrhoids were Grade I (internal hemorrhoids that do not prolapse). Impression:            - Non-bleeding internal hemorrhoids.                        - No specimens collected. Recommendation:        - Discharge patient to home.                        - Resume previous diet.                        - Continue present medications.                        - Repeat colonoscopy in 7 years for surveillance. Procedure Code(s):     --- Professional ---                        [phone], Colonoscopy, flexible; diagnostic, including                         collection of specimen(s) by brushing or washing, when                         performed (separate procedure) Diagnosis Code(s):     --- Professional ---                        Z86.010, Personal history of colonic polyps  CPT copyright 2019 American Medical Association. All rights reserved. The codes documented in this report are preliminary and upon coder review may  be revised to meet current compliance requirements. Lucilla Lame MD, MD 07/06/2020 9:35:29 AM This report has been signed electronically. Number of Addenda: 0 Note Initiated On: 07/06/2020 9:08 AM Scope Withdrawal Time: 0 hours 7 minutes 1 second  Total Procedure Duration: 0 hours 9 minutes 40 seconds  Estimated Blood Loss:  Estimated blood loss: none.      [facility]

## 2020-07-06 NOTE — Op Note (Addendum)
[facility] Gastroenterology Patient Name: Linda Rose Procedure Date: 07/06/2020 9:08 AM MRN: [id number] Account #: [id number] Date of Birth: 1951-06-17 Admit Type: Outpatient Age: 69 Room: [facility] ENDO ROOM 4 Gender: Female Note Status: Finalized Procedure:             Upper GI endoscopy Indications:           Dysphagia Providers:             Lucilla Lame MD, MD Referring MD:          Leonie Douglas. Doy Hutching, MD (Referring MD) Medicines:             Propofol per Anesthesia Complications:         No immediate complications. Procedure:             Pre-Anesthesia Assessment:                        - Prior to the procedure, a History and Physical was                         performed, and patient medications and allergies were                         reviewed. The patient's tolerance of previous                         anesthesia was also reviewed. The risks and benefits                         of the procedure and the sedation options and risks                         were discussed with the patient. All questions were                         answered, and informed consent was obtained. Prior                         Anticoagulants: The patient has taken no previous                         anticoagulant or antiplatelet agents. ASA Grade                         Assessment: II - A patient with mild systemic disease.                         After reviewing the risks and benefits, the patient                         was deemed in satisfactory condition to undergo the                         procedure.                        After obtaining informed consent, the endoscope was  passed under direct vision. Throughout the procedure,                         the patient's blood pressure, pulse, and oxygen                         saturations were monitored continuously. The Endoscope                         was introduced through the mouth, and advanced to the                          second part of duodenum. The upper GI endoscopy was                         accomplished without difficulty. The patient tolerated                         the procedure well. Findings:      A small hiatal hernia was present.      Patchy moderate inflammation characterized by shallow ulcerations was       found in the gastric antrum. Biopsies were taken with a cold forceps for       histology.      The examined duodenum was normal. Impression:            - Small hiatal hernia.                        - Gastritis. Biopsied.                        - Normal examined duodenum. Recommendation:        - Discharge patient to home.                        - Resume previous diet.                        - Continue present medications.                        - Await pathology results.                        - Perform a colonoscopy today. Procedure Code(s):     --- Professional ---                        [phone], Esophagogastroduodenoscopy, flexible,                         transoral; with biopsy, single or multiple Diagnosis Code(s):     --- Professional ---                        R13.10, Dysphagia, unspecified                        K29.70, Gastritis, unspecified, without bleeding CPT copyright 2019 American Medical Association. All rights reserved. The codes documented in this report are preliminary and upon coder review may  be revised to meet current  compliance requirements. Lucilla Lame MD, MD 07/06/2020 9:21:22 AM This report has been signed electronically. Number of Addenda: 0 Note Initiated On: 07/06/2020 9:08 AM Total Procedure Duration: 0 hours 0 minutes 11 seconds  Estimated Blood Loss:  Estimated blood loss: none.      [facility]

## 2020-07-06 NOTE — Anesthesia Preprocedure Evaluation (Signed)
Anesthesia Evaluation  Patient identified by MRN, date of birth, ID band Patient awake    Reviewed: Allergy & Precautions, NPO status , Patient's Chart, lab work & pertinent test results  History of Anesthesia Complications Negative for: history of anesthetic complications  Airway Mallampati: II  TM Distance: >3 FB Neck ROM: Full    Dental  (+) Missing   Pulmonary neg sleep apnea, neg COPD, former smoker,    breath sounds clear to auscultation- rhonchi (-) wheezing      Cardiovascular Exercise Tolerance: Good (-) hypertension(-) CAD, (-) Past MI, (-) Cardiac Stents and (-) CABG  Rhythm:Regular Rate:Normal - Systolic murmurs and - Diastolic murmurs    Neuro/Psych  Headaches, neg Seizures PSYCHIATRIC DISORDERS Anxiety    GI/Hepatic Neg liver ROS, GERD  ,  Endo/Other  neg diabetesHypothyroidism   Renal/GU negative Renal ROS     Musculoskeletal  (+) Arthritis ,   Abdominal (+) - obese,   Peds  Hematology negative hematology ROS (+)   Anesthesia Other Findings Past Medical History: No date: GERD (gastroesophageal reflux disease) No date: Hyperlipidemia No date: Hypothyroidism No date: Migraines No date: Thyroid disease   Reproductive/Obstetrics                             Anesthesia Physical Anesthesia Plan  ASA: II  Anesthesia Plan: General   Post-op Pain Management:    Induction: Intravenous  PONV Risk Score and Plan: 2 and Propofol infusion  Airway Management Planned: Natural Airway  Additional Equipment:   Intra-op Plan:   Post-operative Plan:   Informed Consent: I have reviewed the patients History and Physical, chart, labs and discussed the procedure including the risks, benefits and alternatives for the proposed anesthesia with the patient or authorized representative who has indicated his/her understanding and acceptance.     Dental advisory given  Plan Discussed  with: CRNA and Anesthesiologist  Anesthesia Plan Comments:         Anesthesia Quick Evaluation

## 2020-07-06 NOTE — Transfer of Care (Signed)
Immediate Anesthesia Transfer of Care Note  Patient: Linda Rose  Procedure(s) Performed: ESOPHAGOGASTRODUODENOSCOPY (EGD) WITH PROPOFOL (N/A ) COLONOSCOPY WITH PROPOFOL (N/A )  Patient Location: PACU and Endoscopy Unit  Anesthesia Type:General  Level of Consciousness: drowsy  Airway & Oxygen Therapy: Patient Spontanous Breathing  Post-op Assessment: Report given to RN and Post -op Vital signs reviewed and stable  Post vital signs: Reviewed and stable, Phenylephrine bolus given for low BP  Last Vitals:  Vitals Value Taken Time  BP 78/52 07/06/20 0938  Temp    Pulse 65 07/06/20 0939  Resp 18 07/06/20 0939  SpO2 100 % 07/06/20 0939  Vitals shown include unvalidated device data.  Last Pain:  Vitals:   07/06/20 0853  TempSrc: Tympanic  PainSc: 0-No pain         Complications: No complications documented.

## 2020-07-07 ENCOUNTER — Encounter: Payer: Self-pay | Admitting: Gastroenterology

## 2020-07-07 LAB — SURGICAL PATHOLOGY

## 2020-07-29 ENCOUNTER — Telehealth: Payer: Self-pay | Admitting: Gastroenterology

## 2020-07-29 NOTE — Telephone Encounter (Signed)
Patient wanting to know if the "Brat" diet would help her stomach issues. Pt states she has the probiotic and the probiotic milk, but wants to know if she should try the "brat" diet instead of the keto that she has been doing since June. 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

## 2020-07-29 NOTE — Telephone Encounter (Signed)
Pt has started having diarrhea.

## 2020-07-29 NOTE — Telephone Encounter (Signed)
The patient came to see me for dysphagia and a colonoscopy. I am not sure what she is trying to treat with the BRAT diet.

## 2020-07-29 NOTE — Telephone Encounter (Signed)
I am sorry to say that I am not aware that she was having diarrhea nor do I know what the diarrhea may be caused by. It was not something she was reporting when she saw me.  It is unlikely that the brat diet well hurt her and she may want to try it.

## 2020-07-30 NOTE — Telephone Encounter (Signed)
Pt.notified

## 2020-11-09 ENCOUNTER — Ambulatory Visit: Payer: Medicare Other

## 2020-11-15 ENCOUNTER — Ambulatory Visit: Payer: Medicare Other

## 2020-11-23 ENCOUNTER — Ambulatory Visit: Payer: Medicare Other

## 2020-11-26 ENCOUNTER — Ambulatory Visit: Payer: Medicare Other

## 2020-12-03 ENCOUNTER — Ambulatory Visit: Payer: Medicare Other

## 2020-12-08 DIAGNOSIS — E039 Hypothyroidism, unspecified: Secondary | ICD-10-CM | POA: Diagnosis not present

## 2020-12-08 DIAGNOSIS — E7800 Pure hypercholesterolemia, unspecified: Secondary | ICD-10-CM | POA: Diagnosis not present

## 2020-12-08 DIAGNOSIS — R21 Rash and other nonspecific skin eruption: Secondary | ICD-10-CM | POA: Diagnosis not present

## 2020-12-24 ENCOUNTER — Other Ambulatory Visit (INDEPENDENT_AMBULATORY_CARE_PROVIDER_SITE_OTHER): Payer: Self-pay | Admitting: Vascular Surgery

## 2020-12-24 DIAGNOSIS — I6523 Occlusion and stenosis of bilateral carotid arteries: Secondary | ICD-10-CM

## 2020-12-28 ENCOUNTER — Ambulatory Visit (INDEPENDENT_AMBULATORY_CARE_PROVIDER_SITE_OTHER): Payer: PPO

## 2020-12-28 ENCOUNTER — Other Ambulatory Visit: Payer: Self-pay

## 2020-12-28 ENCOUNTER — Ambulatory Visit (INDEPENDENT_AMBULATORY_CARE_PROVIDER_SITE_OTHER): Payer: PPO | Admitting: Vascular Surgery

## 2020-12-28 VITALS — BP 144/78 | HR 62 | Ht 60.0 in | Wt 136.0 lb

## 2020-12-28 DIAGNOSIS — I6523 Occlusion and stenosis of bilateral carotid arteries: Secondary | ICD-10-CM

## 2020-12-28 DIAGNOSIS — E785 Hyperlipidemia, unspecified: Secondary | ICD-10-CM | POA: Diagnosis not present

## 2020-12-28 NOTE — Assessment & Plan Note (Signed)
We performed a carotid duplex today to reevaluate her carotid disease. This shows 1-39% carotid artery stenosis bilaterally. She is on aspirin and a statin agent. Will follow this mild disease every other year with duplex.

## 2020-12-28 NOTE — Progress Notes (Signed)
Patient ID: Linda Rose, female   DOB: [birth year], 70 y.o.   MRN: [id number]  Chief Complaint  Patient presents with  . Follow-up    [place] carotis stenosis   . Carotid    HPI Linda Rose is a 70 y.o. female.  I am asked to see the patient by Dr. Doy Hutching for evaluation of carotid stenosis.  The patient reports no focal symptoms of cerebrovascular ischemia. Specifically, the patient denies amaurosis fugax, speech or swallowing difficulties, or arm or leg weakness or numbness.  She was seen about four years ago and had mild disease at that time. Has not been checked since. We performed a carotid duplex today to reevaluate her carotid disease. This shows 1-39% carotid artery stenosis bilaterally.   Past Medical History:  Diagnosis Date  . GERD (gastroesophageal reflux disease)   . Hyperlipidemia   . Hypothyroidism   . Migraines   . Thyroid disease     Past Surgical History:  Procedure Laterality Date  . ABDOMINAL HYSTERECTOMY    . COLONOSCOPY WITH PROPOFOL N/A 07/06/2020   Procedure: COLONOSCOPY WITH PROPOFOL;  Surgeon: Lucilla Lame, MD;  Location: [facility] ENDOSCOPY;  Service: Endoscopy;  Laterality: N/A;  . ESOPHAGOGASTRODUODENOSCOPY (EGD) WITH PROPOFOL N/A 07/06/2020   Procedure: ESOPHAGOGASTRODUODENOSCOPY (EGD) WITH PROPOFOL;  Surgeon: Lucilla Lame, MD;  Location: [facility] ENDOSCOPY;  Service: Endoscopy;  Laterality: N/A;  . FRACTURE SURGERY     Tibulala and Fibula   . SPINAL FUSION    . TOTAL KNEE ARTHROPLASTY Right 01/20/2019   Procedure: TOTAL KNEE ARTHROPLASTY;  Surgeon: Vickey Huger, MD;  Location: [facility] ORS;  Service: Orthopedics;  Laterality: Right;  . TRIGGER FINGER RELEASE Bilateral 10/2017   x 4     Family History  Problem Relation Age of Onset  . Diabetes Mother   . Hypertension Mother   . Heart failure Mother   . Thyroid disease Father   . Breast cancer Neg Hx      Social History   Tobacco Use  . Smoking status: Former Smoker    Packs/day: 0.50    Years:  20.00    Pack years: 10.00    Types: Cigarettes  . Smokeless tobacco: Never Used  Vaping Use  . Vaping Use: Never used  Substance Use Topics  . Alcohol use: No    Alcohol/week: 0.0 standard drinks  . Drug use: No    Allergies  Allergen Reactions  . Iodinated Diagnostic Agents Rash and Swelling  . Metrizamide Rash    Current Outpatient Medications  Medication Sig Dispense Refill  . ALPRAZolam (XANAX) 0.25 MG tablet Take 0.25-0.5 mg by mouth at bedtime.     Marland Kitchen aspirin EC 81 MG tablet Take 81 mg by mouth daily. Swallow whole.    Marland Kitchen atorvastatin (LIPITOR) 10 MG tablet Take 10 mg by mouth at bedtime.     . Azelastine HCl 0.15 % SOLN SMARTSIG:2 Puff(s) Both Nares Daily    . Azelastine-Fluticasone 137-50 MCG/ACT SUSP Place 1 spray into the nose daily as needed (allergies).    . cetirizine (ZYRTEC) 10 MG tablet Take 10 mg by mouth daily as needed for allergies.    Marland Kitchen docusate sodium (COLACE) 100 MG capsule Take 100 mg by mouth daily as needed for mild constipation.    Marland Kitchen estradiol (ESTRACE) 0.5 MG tablet Take 0.5 mg by mouth at bedtime.     Marland Kitchen levothyroxine (SYNTHROID, LEVOTHROID) 112 MCG tablet Take 112 mcg by mouth daily.     . meloxicam (MOBIC)  15 MG tablet Take 15 mg by mouth daily.    . Multiple Vitamins-Minerals (PRESERVISION AREDS 2) CAPS Take 1 capsule by mouth 2 (two) times daily.    Marland Kitchen omeprazole (PRILOSEC) 40 MG capsule Take 40 mg by mouth daily as needed (acid reflux).     Marland Kitchen acetaminophen (TYLENOL) 650 MG CR tablet Take 650 mg by mouth daily as needed for pain. (Patient not taking: Reported on 12/28/2020)    . chlorhexidine (PERIDEX) 0.12 % solution chlorhexidine gluconate 0.12 % mouthwash (Patient not taking: Reported on 12/28/2020)    . diclofenac sodium (VOLTAREN) 1 % GEL Apply 2 g topically 4 (four) times daily. Rub into affected area of foot 2 to 4 times daily (Patient not taking: Reported on 12/28/2020) 100 g 2  . ibuprofen (ADVIL) 600 MG tablet ibuprofen 600 mg tablet (Patient  not taking: Reported on 12/28/2020)    . neomycin-bacitracin-polymyxin (NEOSPORIN) ointment Apply 1 application topically as needed for wound care. (Patient not taking: Reported on 12/28/2020)    . Olopatadine HCl 0.2 % SOLN Place 1 drop into both eyes daily as needed (allergies). (Patient not taking: Reported on 12/28/2020)    . SUMAtriptan (IMITREX) 100 MG tablet Take 50 mg by mouth every 2 (two) hours as needed for migraine.  (Patient not taking: Reported on 12/28/2020)     No current facility-administered medications for this visit.      REVIEW OF SYSTEMS (Negative unless checked)  Constitutional: [] Weight loss  [] Fever  [] Chills Cardiac: [] Chest pain   [] Chest pressure   [] Palpitations   [] Shortness of breath when laying flat   [] Shortness of breath at rest   [] Shortness of breath with exertion. Vascular:  [] Pain in legs with walking   [] Pain in legs at rest   [] Pain in legs when laying flat   [] Claudication   [] Pain in feet when walking  [] Pain in feet at rest  [] Pain in feet when laying flat   [] History of DVT   [] Phlebitis   [] Swelling in legs   [] Varicose veins   [] Non-healing ulcers Pulmonary:   [] Uses home oxygen   [] Productive cough   [] Hemoptysis   [] Wheeze  [] COPD   [] Asthma Neurologic:  [] Dizziness  [] Blackouts   [] Seizures   [] History of stroke   [] History of TIA  [] Aphasia   [] Temporary blindness   [] Dysphagia   [] Weakness or numbness in arms   [] Weakness or numbness in legs Musculoskeletal:  [x] Arthritis   [] Joint swelling   [x] Joint pain   [] Low back pain Hematologic:  [] Easy bruising  [] Easy bleeding   [] Hypercoagulable state   [] Anemic  [] Hepatitis Gastrointestinal:  [] Blood in stool   [] Vomiting blood  [x] Gastroesophageal reflux/heartburn   [] Abdominal pain Genitourinary:  [] Chronic kidney disease   [] Difficult urination  [] Frequent urination  [] Burning with urination   [] Hematuria Skin:  [] Rashes   [] Ulcers   [] Wounds Psychological:  [] History of anxiety   []  History of major  depression.    Physical Exam BP (!) 144/78   Pulse 62   Ht 5' (1.524 m)   Wt 136 lb (61.7 kg)   LMP  (LMP Unknown)   BMI 26.56 kg/m  Gen:  WD/WN, NAD. Appears younger than stated age. Head: [place]/AT, No temporalis wasting.  Ear/Nose/Throat: Hearing grossly intact, nares w/o erythema or drainage, oropharynx w/o Erythema/Exudate Eyes: Conjunctiva clear, sclera non-icteric  Neck: trachea midline.  No bruit or JVD.  Pulmonary:  Good air movement, clear to auscultation bilaterally.  Cardiac: RRR, no JVD Vascular:  Vessel Right Left  Radial Palpable Palpable               Gastrointestinal: soft, non-tender/non-distended. Musculoskeletal: M/S 5/5 throughout.  Extremities without ischemic changes.  No deformity or atrophy. No edema. Neurologic: Sensation grossly intact in extremities.  Symmetrical.  Speech is fluent. Motor exam as listed above. Psychiatric: Judgment intact, Mood & affect appropriate for pt's clinical situation. Dermatologic: No rashes or ulcers noted.  No cellulitis or open wounds.    Radiology No results found.  Labs No results found for this or any previous visit (from the past 2160 hour(s)).  Assessment/Plan:  Bilateral carotid artery stenosis We performed a carotid duplex today to reevaluate her carotid disease. This shows 1-39% carotid artery stenosis bilaterally. She is on aspirin and a statin agent. Will follow this mild disease every other year with duplex.  Hyperlipidemia lipid control important in reducing the progression of atherosclerotic disease. Continue statin therapy       Leotis Pain 12/28/2020, 10:25 AM   This note was created with Dragon medical transcription system.  Any errors from dictation are unintentional.

## 2020-12-28 NOTE — Assessment & Plan Note (Signed)
lipid control important in reducing the progression of atherosclerotic disease. Continue statin therapy  

## 2020-12-28 NOTE — Patient Instructions (Signed)
Carotid Artery Disease  Carotid artery disease is the narrowing or blockage of one or both carotid arteries. This condition is also called carotid artery stenosis. The carotid arteries are the two main blood vessels on either side of the neck. They send blood to the brain, other parts of the head, and the neck.  This condition increases your risk for a stroke or a transient ischemic attack (TIA). A TIA is a "mini-stroke" that causes stroke-like symptoms that go away quickly. What are the causes? This condition is mainly caused by a narrowing and hardening of the carotid arteries. The carotid arteries can become narrow or clogged with a buildup of plaque. Plaque includes:  Fat.  Cholesterol.  Calcium.  Other substances. What increases the risk? The following factors may make you more likely to develop this condition:  Having certain medical conditions, such as: ? High cholesterol. ? High blood pressure. ? Diabetes. ? Obesity.  Smoking.  A family history of cardiovascular disease.  Not being active or lack of regular exercise.  Being female. Men have a higher risk of having arteries become narrow and harden earlier in life than women.  Old age. What are the signs or symptoms? This condition may not have any signs or symptoms until a stroke or TIA happens. In some cases, your doctor may be able to hear a whooshing sound. This can suggest a change in blood flow caused by plaque buildup. An eye exam can also help find signs of the condition. How is this treated? This condition may be treated with more than one treatment. Treatment options include:  Lifestyle changes, such as: ? Quitting smoking. ? Getting regular exercise, or getting exercise as told by your doctor. ? Eating a healthy diet. ? Managing stress. ? Keeping a healthy weight.  Medicines to control: ? Blood pressure. ? Cholesterol. ? Blood clotting.  Surgery. You may have: ? A surgery to remove the blockages in  the carotid arteries. ? A procedure in which a small mesh tube (stent) is used to widen the blocked carotid arteries. Follow these instructions at home: Eating and drinking Follow instructions about your diet from your doctor. It is important to follow a healthy diet.  Eat a diet that includes: ? A lot of fresh fruits and vegetables. ? Low-fat (lean) meats.  Avoid these foods: ? Foods that are high in fat. ? Foods that are high in salt (sodium). ? Foods that are fried. ? Foods that are processed. ? Foods that have few good nutrients (poor nutritional value).   Lifestyle  Keep a healthy weight.  Do exercises as told by your doctor to stay active. Each week, you should get one of the following: ? At least 150 minutes of exercise that raises your heart rate and makes you sweat (moderate-intensity exercise). ? At least 75 minutes of exercise that takes a lot of effort.  Do not use any products that contain nicotine or tobacco, such as cigarettes, e-cigarettes, and chewing tobacco. If you need help quitting, ask your doctor.  Do not drink alcohol if: ? Your doctor tells you not to drink. ? You are pregnant, may be pregnant, or are planning to become pregnant.  If you drink alcohol: ? Limit how much you use to:  0-1 drink a day for women.  0-2 drinks a day for men. ? Be aware of how much alcohol is in your drink. In the [place] one drink equals one 12 oz bottle of beer (355 mL), one 5   oz glass of wine (148 mL), or one 1 oz glass of hard liquor (44 mL).  Do not use drugs.  Manage your stress. Ask your doctor for tips on how to do this.   General instructions  Take over-the-counter and prescription medicines only as told by your doctor.  Keep all follow-up visits as told by your doctor. This is important. Where to find more information  American Heart Association: www.heart.org Get help right away if:  You have any signs of a stroke. "BE FAST" is an easy way to remember the  main warning signs: ? B - Balance. Signs are dizziness, sudden trouble walking, or loss of balance. ? E - Eyes. Signs are trouble seeing or a change in how you see. ? F - Face. Signs are sudden weakness or loss of feeling of the face, or the face or eyelid drooping on one side. ? A - Arms. Signs are weakness or loss of feeling in an arm. This happens suddenly and usually on one side of the body. ? S - Speech. Signs are sudden trouble speaking, slurred speech, or trouble understanding what people say. ? T - Time. Time to call emergency services. Write down what time symptoms started.  You have other signs of a stroke, such as: ? A sudden, very bad headache with no known cause. ? Feeling like you may vomit (nausea). ? Vomiting. ? A seizure. These symptoms may be an emergency. Do not wait to see if the symptoms will go away. Get medical help right away. Call your local emergency services (911 in the [place] Do not drive yourself to the hospital. Summary  The carotid arteries are blood vessels on both sides of the neck.  If these arteries get smaller or get blocked, you are more likely to have a stroke or a mini-stroke.  This condition can be treated with lifestyle changes, medicines, surgery, or a blend of these treatments.  Get help right away if you have any signs of a stroke. "BE FAST" is an easy way to remember the main warning signs of stroke. This information is not intended to replace advice given to you by your health care provider. Make sure you discuss any questions you have with your health care provider. Document Revised: 05/19/2019 Document Reviewed: 05/19/2019 Elsevier Patient Education  2021 [employer] Inc.  

## 2021-03-25 DIAGNOSIS — J208 Acute bronchitis due to other specified organisms: Secondary | ICD-10-CM | POA: Diagnosis not present

## 2021-03-25 DIAGNOSIS — E7800 Pure hypercholesterolemia, unspecified: Secondary | ICD-10-CM | POA: Diagnosis not present

## 2021-03-25 DIAGNOSIS — U071 COVID-19: Secondary | ICD-10-CM | POA: Diagnosis not present

## 2021-04-08 DIAGNOSIS — Z0000 Encounter for general adult medical examination without abnormal findings: Secondary | ICD-10-CM | POA: Diagnosis not present

## 2021-04-08 DIAGNOSIS — Z79899 Other long term (current) drug therapy: Secondary | ICD-10-CM | POA: Diagnosis not present

## 2021-04-08 DIAGNOSIS — Z1231 Encounter for screening mammogram for malignant neoplasm of breast: Secondary | ICD-10-CM | POA: Diagnosis not present

## 2021-04-08 DIAGNOSIS — E782 Mixed hyperlipidemia: Secondary | ICD-10-CM | POA: Diagnosis not present

## 2021-04-08 DIAGNOSIS — F411 Generalized anxiety disorder: Secondary | ICD-10-CM | POA: Diagnosis not present

## 2021-04-08 DIAGNOSIS — E039 Hypothyroidism, unspecified: Secondary | ICD-10-CM | POA: Diagnosis not present

## 2021-04-13 ENCOUNTER — Other Ambulatory Visit: Payer: Self-pay | Admitting: Internal Medicine

## 2021-04-13 DIAGNOSIS — Z1231 Encounter for screening mammogram for malignant neoplasm of breast: Secondary | ICD-10-CM

## 2021-04-20 DIAGNOSIS — E039 Hypothyroidism, unspecified: Secondary | ICD-10-CM | POA: Diagnosis not present

## 2021-04-20 DIAGNOSIS — Z79899 Other long term (current) drug therapy: Secondary | ICD-10-CM | POA: Diagnosis not present

## 2021-04-20 DIAGNOSIS — E782 Mixed hyperlipidemia: Secondary | ICD-10-CM | POA: Diagnosis not present

## 2021-05-18 ENCOUNTER — Other Ambulatory Visit: Payer: Self-pay

## 2021-05-18 ENCOUNTER — Ambulatory Visit
Admission: RE | Admit: 2021-05-18 | Discharge: 2021-05-18 | Disposition: A | Discharge status: Home / Self Care | Payer: PPO | Source: Ambulatory Visit | Attending: Internal Medicine | Admitting: Internal Medicine

## 2021-05-18 DIAGNOSIS — Z1231 Encounter for screening mammogram for malignant neoplasm of breast: Secondary | ICD-10-CM | POA: Insufficient documentation

## 2021-05-24 ENCOUNTER — Other Ambulatory Visit: Payer: Self-pay | Admitting: Internal Medicine

## 2021-05-24 DIAGNOSIS — N6489 Other specified disorders of breast: Secondary | ICD-10-CM

## 2021-05-24 DIAGNOSIS — R928 Other abnormal and inconclusive findings on diagnostic imaging of breast: Secondary | ICD-10-CM

## 2021-05-27 ENCOUNTER — Ambulatory Visit
Admission: RE | Admit: 2021-05-27 | Discharge: 2021-05-27 | Disposition: A | Discharge status: Home / Self Care | Payer: PPO | Source: Ambulatory Visit | Attending: Internal Medicine | Admitting: Internal Medicine

## 2021-05-27 ENCOUNTER — Other Ambulatory Visit: Payer: Self-pay

## 2021-05-27 DIAGNOSIS — N6489 Other specified disorders of breast: Secondary | ICD-10-CM | POA: Insufficient documentation

## 2021-05-27 DIAGNOSIS — R928 Other abnormal and inconclusive findings on diagnostic imaging of breast: Secondary | ICD-10-CM

## 2021-05-27 DIAGNOSIS — R922 Inconclusive mammogram: Secondary | ICD-10-CM | POA: Diagnosis not present

## 2021-06-21 DIAGNOSIS — D225 Melanocytic nevi of trunk: Secondary | ICD-10-CM | POA: Diagnosis not present

## 2021-06-21 DIAGNOSIS — D2262 Melanocytic nevi of left upper limb, including shoulder: Secondary | ICD-10-CM | POA: Diagnosis not present

## 2021-06-21 DIAGNOSIS — D2271 Melanocytic nevi of right lower limb, including hip: Secondary | ICD-10-CM | POA: Diagnosis not present

## 2021-06-21 DIAGNOSIS — B001 Herpesviral vesicular dermatitis: Secondary | ICD-10-CM | POA: Diagnosis not present

## 2021-06-21 DIAGNOSIS — L821 Other seborrheic keratosis: Secondary | ICD-10-CM | POA: Diagnosis not present

## 2021-06-21 DIAGNOSIS — D2261 Melanocytic nevi of right upper limb, including shoulder: Secondary | ICD-10-CM | POA: Diagnosis not present

## 2021-06-21 DIAGNOSIS — D2272 Melanocytic nevi of left lower limb, including hip: Secondary | ICD-10-CM | POA: Diagnosis not present

## 2021-07-28 DIAGNOSIS — H2513 Age-related nuclear cataract, bilateral: Secondary | ICD-10-CM | POA: Diagnosis not present

## 2021-09-13 DIAGNOSIS — J069 Acute upper respiratory infection, unspecified: Secondary | ICD-10-CM | POA: Diagnosis not present

## 2021-09-21 DIAGNOSIS — M545 Low back pain, unspecified: Secondary | ICD-10-CM | POA: Diagnosis not present

## 2021-09-21 DIAGNOSIS — M2578 Osteophyte, vertebrae: Secondary | ICD-10-CM | POA: Diagnosis not present

## 2021-11-02 DIAGNOSIS — E039 Hypothyroidism, unspecified: Secondary | ICD-10-CM | POA: Diagnosis not present

## 2021-11-02 DIAGNOSIS — M158 Other polyosteoarthritis: Secondary | ICD-10-CM | POA: Diagnosis not present

## 2021-11-02 DIAGNOSIS — Z79899 Other long term (current) drug therapy: Secondary | ICD-10-CM | POA: Diagnosis not present

## 2021-11-02 DIAGNOSIS — F411 Generalized anxiety disorder: Secondary | ICD-10-CM | POA: Diagnosis not present

## 2021-11-02 DIAGNOSIS — Z0000 Encounter for general adult medical examination without abnormal findings: Secondary | ICD-10-CM | POA: Diagnosis not present

## 2021-11-02 DIAGNOSIS — E782 Mixed hyperlipidemia: Secondary | ICD-10-CM | POA: Diagnosis not present

## 2022-04-25 DIAGNOSIS — M79671 Pain in right foot: Secondary | ICD-10-CM | POA: Diagnosis not present

## 2022-04-25 DIAGNOSIS — Z1231 Encounter for screening mammogram for malignant neoplasm of breast: Secondary | ICD-10-CM | POA: Diagnosis not present

## 2022-04-25 DIAGNOSIS — Z79899 Other long term (current) drug therapy: Secondary | ICD-10-CM | POA: Diagnosis not present

## 2022-04-25 DIAGNOSIS — E039 Hypothyroidism, unspecified: Secondary | ICD-10-CM | POA: Diagnosis not present

## 2022-04-25 DIAGNOSIS — E782 Mixed hyperlipidemia: Secondary | ICD-10-CM | POA: Diagnosis not present

## 2022-04-25 DIAGNOSIS — Z0000 Encounter for general adult medical examination without abnormal findings: Secondary | ICD-10-CM | POA: Diagnosis not present

## 2022-04-25 DIAGNOSIS — F411 Generalized anxiety disorder: Secondary | ICD-10-CM | POA: Diagnosis not present

## 2022-04-25 DIAGNOSIS — R030 Elevated blood-pressure reading, without diagnosis of hypertension: Secondary | ICD-10-CM | POA: Diagnosis not present

## 2022-04-26 ENCOUNTER — Other Ambulatory Visit: Payer: Self-pay | Admitting: Internal Medicine

## 2022-04-26 DIAGNOSIS — Z1231 Encounter for screening mammogram for malignant neoplasm of breast: Secondary | ICD-10-CM

## 2022-05-03 DIAGNOSIS — Z79899 Other long term (current) drug therapy: Secondary | ICD-10-CM | POA: Diagnosis not present

## 2022-05-03 DIAGNOSIS — E039 Hypothyroidism, unspecified: Secondary | ICD-10-CM | POA: Diagnosis not present

## 2022-05-03 DIAGNOSIS — E782 Mixed hyperlipidemia: Secondary | ICD-10-CM | POA: Diagnosis not present

## 2022-06-23 DIAGNOSIS — L858 Other specified epidermal thickening: Secondary | ICD-10-CM | POA: Diagnosis not present

## 2022-06-23 DIAGNOSIS — R202 Paresthesia of skin: Secondary | ICD-10-CM | POA: Diagnosis not present

## 2022-06-23 DIAGNOSIS — D2272 Melanocytic nevi of left lower limb, including hip: Secondary | ICD-10-CM | POA: Diagnosis not present

## 2022-06-23 DIAGNOSIS — D2262 Melanocytic nevi of left upper limb, including shoulder: Secondary | ICD-10-CM | POA: Diagnosis not present

## 2022-06-23 DIAGNOSIS — L821 Other seborrheic keratosis: Secondary | ICD-10-CM | POA: Diagnosis not present

## 2022-06-23 DIAGNOSIS — D225 Melanocytic nevi of trunk: Secondary | ICD-10-CM | POA: Diagnosis not present

## 2022-06-23 DIAGNOSIS — D2271 Melanocytic nevi of right lower limb, including hip: Secondary | ICD-10-CM | POA: Diagnosis not present

## 2022-06-23 DIAGNOSIS — L814 Other melanin hyperpigmentation: Secondary | ICD-10-CM | POA: Diagnosis not present

## 2022-06-23 DIAGNOSIS — D485 Neoplasm of uncertain behavior of skin: Secondary | ICD-10-CM | POA: Diagnosis not present

## 2022-06-23 DIAGNOSIS — D2261 Melanocytic nevi of right upper limb, including shoulder: Secondary | ICD-10-CM | POA: Diagnosis not present

## 2022-07-25 ENCOUNTER — Ambulatory Visit
Admission: RE | Admit: 2022-07-25 | Discharge: 2022-07-25 | Disposition: A | Discharge status: Home / Self Care | Payer: PPO | Source: Ambulatory Visit | Attending: Internal Medicine | Admitting: Internal Medicine

## 2022-07-25 DIAGNOSIS — Z1231 Encounter for screening mammogram for malignant neoplasm of breast: Secondary | ICD-10-CM | POA: Diagnosis not present

## 2022-07-26 IMAGING — MG MM DIGITAL SCREENING BILAT W/ TOMO AND CAD
8 series · 8 of 24 positions shown · non-contrast
Comparison: Previous exam(s).

CLINICAL DATA: Screening.

EXAM:
DIGITAL SCREENING BILATERAL MAMMOGRAM WITH TOMOSYNTHESIS AND CAD
TECHNIQUE: Bilateral screening digital craniocaudal and mediolateral oblique
mammograms were obtained. Bilateral screening digital breast
tomosynthesis was performed. The images were evaluated with
computer-aided detection.

[R MLO synth-2D]
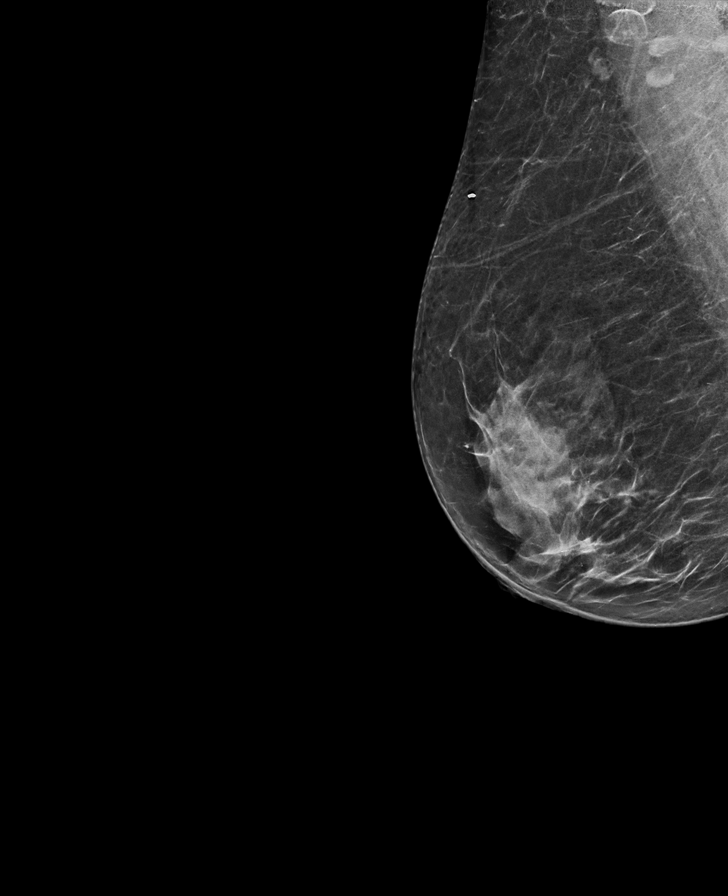

[L CC synth-2D]
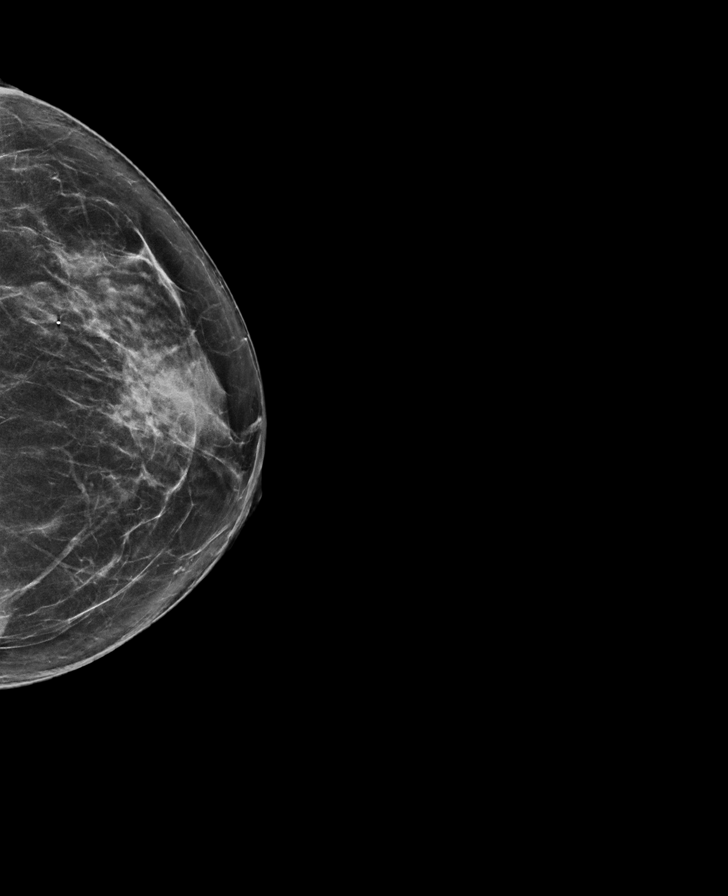

[L MLO synth-2D]
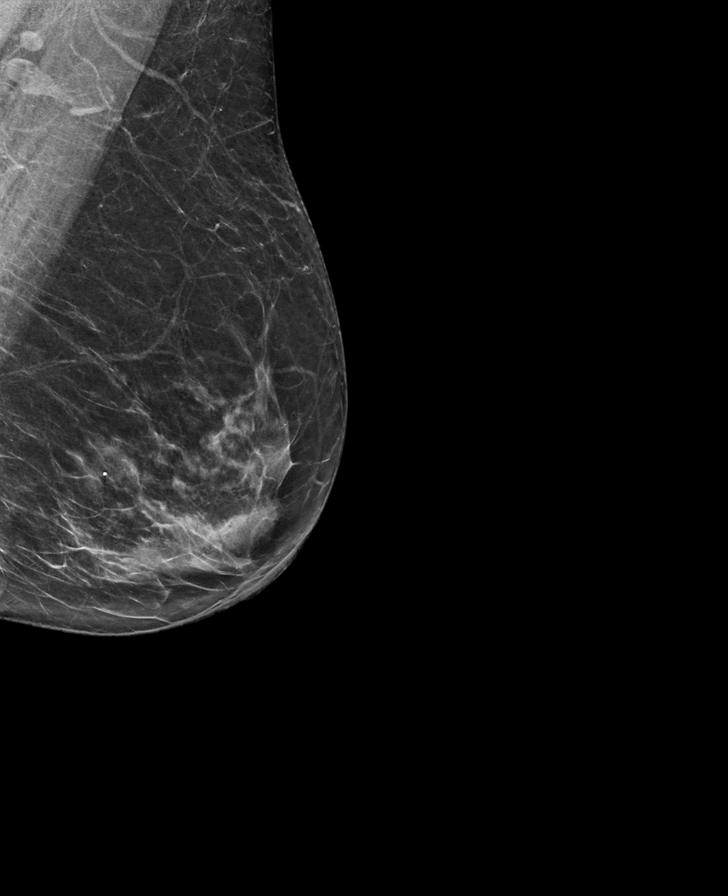

[R CC synth-2D]
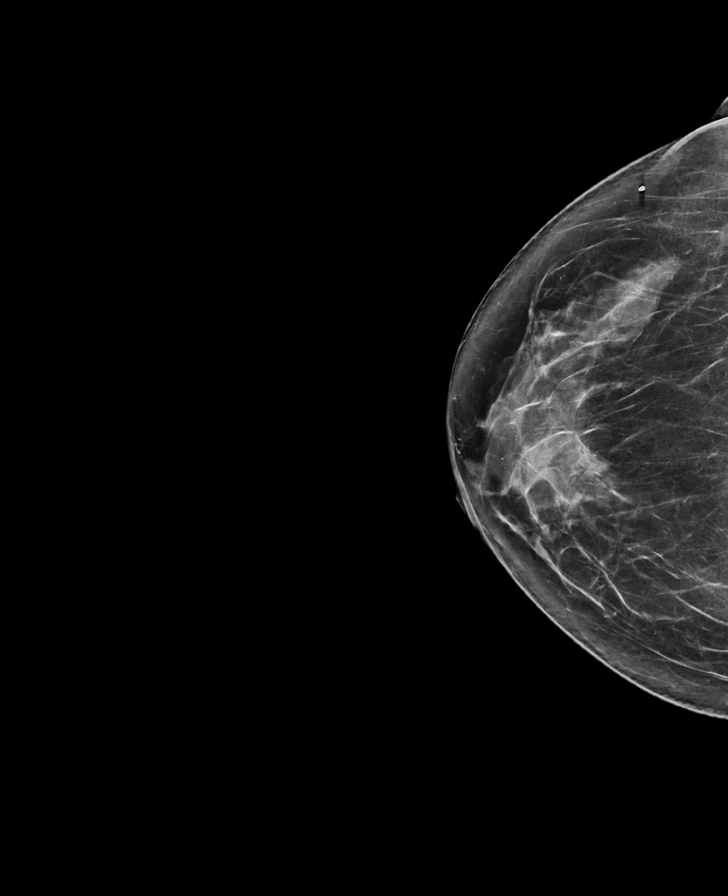

[L CC tomo · tomo slice 35/70.0]
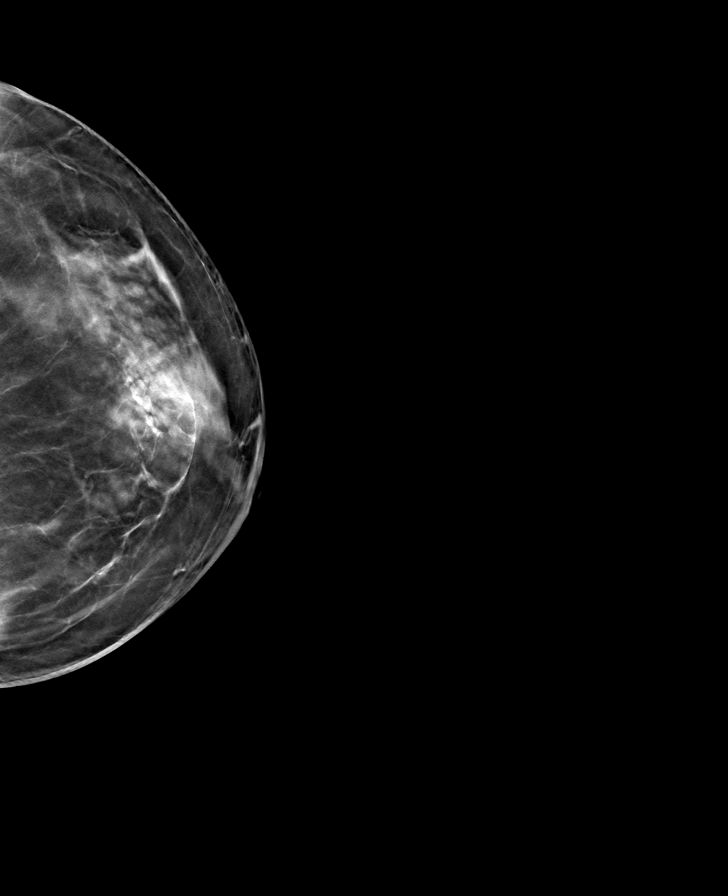

[R CC tomo · tomo slice 38/75.0]
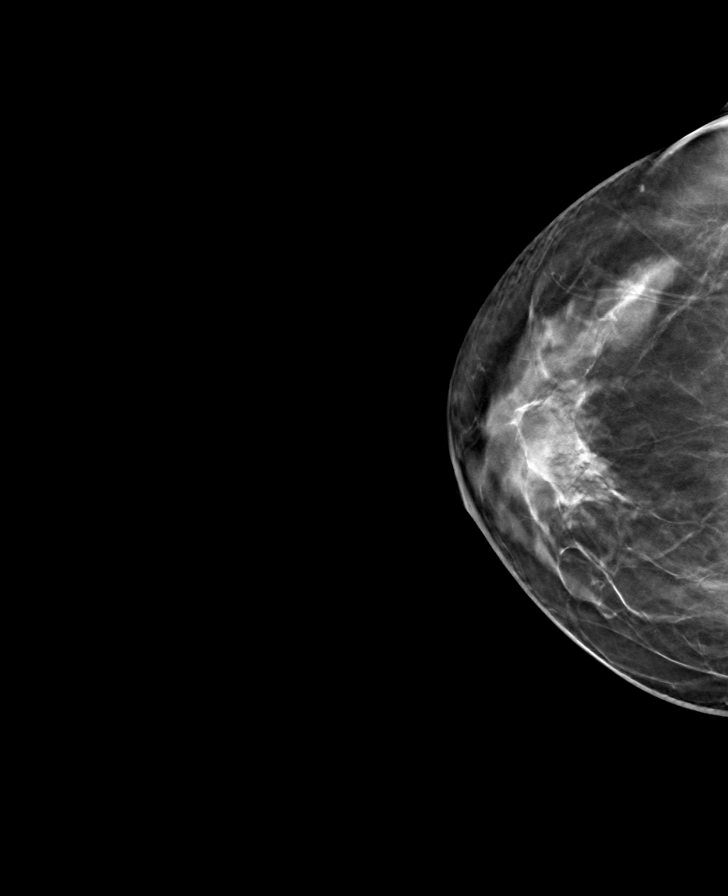

[R MLO tomo · tomo slice 34/67.0]
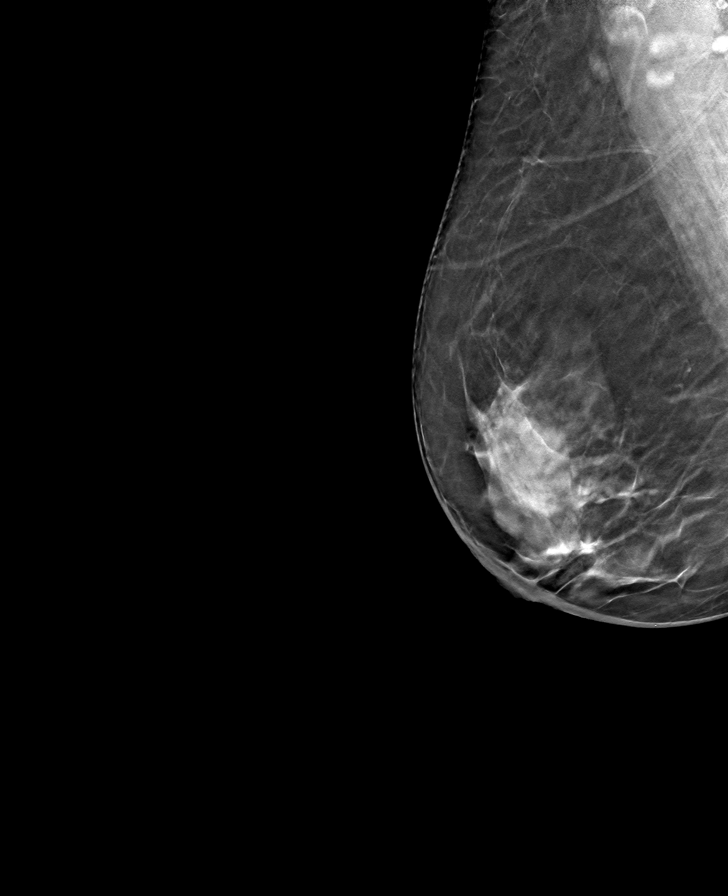

[L MLO tomo · tomo slice 36/71.0]
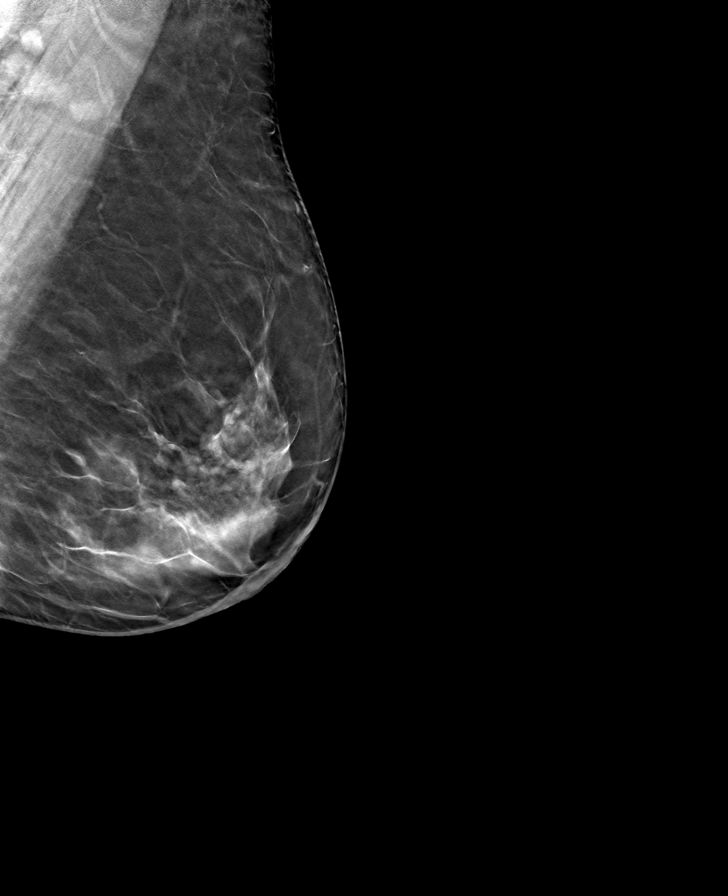

[8 of 24 positions shown; findings below may reference images not displayed]

ACR Breast Density Category c: The breast tissue is heterogeneously
dense, which may obscure small masses.
FINDINGS: In the left breast, a possible asymmetry warrants further
evaluation. In the right breast, no findings suspicious for
malignancy.
IMPRESSION: Further evaluation is suggested for possible asymmetry in the left
breast.

RECOMMENDATION:
Diagnostic mammogram and possibly ultrasound of the left breast.
(Code:[id number]-[id number])

The patient will be contacted regarding the findings, and additional
imaging will be scheduled.

BI-RADS CATEGORY  0: Incomplete. Need additional imaging evaluation
and/or prior mammograms for comparison.

## 2022-08-02 DIAGNOSIS — H5213 Myopia, bilateral: Secondary | ICD-10-CM | POA: Diagnosis not present

## 2022-08-09 DIAGNOSIS — Z23 Encounter for immunization: Secondary | ICD-10-CM | POA: Diagnosis not present

## 2022-09-04 ENCOUNTER — Encounter (INDEPENDENT_AMBULATORY_CARE_PROVIDER_SITE_OTHER): Payer: Self-pay

## 2022-11-07 DIAGNOSIS — N951 Menopausal and female climacteric states: Secondary | ICD-10-CM | POA: Diagnosis not present

## 2022-11-07 DIAGNOSIS — Z79899 Other long term (current) drug therapy: Secondary | ICD-10-CM | POA: Diagnosis not present

## 2022-11-07 DIAGNOSIS — E039 Hypothyroidism, unspecified: Secondary | ICD-10-CM | POA: Diagnosis not present

## 2022-11-07 DIAGNOSIS — R030 Elevated blood-pressure reading, without diagnosis of hypertension: Secondary | ICD-10-CM | POA: Diagnosis not present

## 2022-11-07 DIAGNOSIS — F411 Generalized anxiety disorder: Secondary | ICD-10-CM | POA: Diagnosis not present

## 2022-11-07 DIAGNOSIS — Z0000 Encounter for general adult medical examination without abnormal findings: Secondary | ICD-10-CM | POA: Diagnosis not present

## 2022-11-07 DIAGNOSIS — F5104 Psychophysiologic insomnia: Secondary | ICD-10-CM | POA: Diagnosis not present

## 2022-11-07 DIAGNOSIS — E7800 Pure hypercholesterolemia, unspecified: Secondary | ICD-10-CM | POA: Diagnosis not present

## 2023-01-24 DIAGNOSIS — M5442 Lumbago with sciatica, left side: Secondary | ICD-10-CM | POA: Diagnosis not present

## 2023-02-09 DIAGNOSIS — E039 Hypothyroidism, unspecified: Secondary | ICD-10-CM | POA: Diagnosis not present

## 2023-02-14 ENCOUNTER — Other Ambulatory Visit: Payer: Self-pay | Admitting: Physical Medicine & Rehabilitation

## 2023-02-14 DIAGNOSIS — M5442 Lumbago with sciatica, left side: Secondary | ICD-10-CM

## 2023-02-23 DIAGNOSIS — M5136 Other intervertebral disc degeneration, lumbar region: Secondary | ICD-10-CM | POA: Diagnosis not present

## 2023-03-21 DIAGNOSIS — M4726 Other spondylosis with radiculopathy, lumbar region: Secondary | ICD-10-CM | POA: Diagnosis not present

## 2023-03-27 DIAGNOSIS — M4726 Other spondylosis with radiculopathy, lumbar region: Secondary | ICD-10-CM | POA: Diagnosis not present

## 2023-03-27 DIAGNOSIS — M5431 Sciatica, right side: Secondary | ICD-10-CM | POA: Diagnosis not present

## 2023-03-28 ENCOUNTER — Other Ambulatory Visit: Payer: Self-pay | Admitting: Internal Medicine

## 2023-03-28 DIAGNOSIS — M5431 Sciatica, right side: Secondary | ICD-10-CM

## 2023-04-03 ENCOUNTER — Ambulatory Visit
Admission: RE | Admit: 2023-04-03 | Discharge: 2023-04-03 | Disposition: A | Discharge status: Home / Self Care | Payer: Medicare HMO | Source: Ambulatory Visit | Attending: Internal Medicine | Admitting: Internal Medicine

## 2023-04-03 DIAGNOSIS — M5431 Sciatica, right side: Secondary | ICD-10-CM | POA: Diagnosis not present

## 2023-04-03 DIAGNOSIS — M545 Low back pain, unspecified: Secondary | ICD-10-CM | POA: Diagnosis not present

## 2023-04-04 DIAGNOSIS — M4726 Other spondylosis with radiculopathy, lumbar region: Secondary | ICD-10-CM | POA: Diagnosis not present

## 2023-04-11 DIAGNOSIS — M4726 Other spondylosis with radiculopathy, lumbar region: Secondary | ICD-10-CM | POA: Diagnosis not present

## 2023-04-18 DIAGNOSIS — M4726 Other spondylosis with radiculopathy, lumbar region: Secondary | ICD-10-CM | POA: Diagnosis not present

## 2023-04-27 DIAGNOSIS — M48061 Spinal stenosis, lumbar region without neurogenic claudication: Secondary | ICD-10-CM | POA: Diagnosis not present

## 2023-04-27 DIAGNOSIS — Z87891 Personal history of nicotine dependence: Secondary | ICD-10-CM | POA: Diagnosis not present

## 2023-04-27 DIAGNOSIS — E039 Hypothyroidism, unspecified: Secondary | ICD-10-CM | POA: Diagnosis not present

## 2023-04-27 DIAGNOSIS — Z79890 Hormone replacement therapy (postmenopausal): Secondary | ICD-10-CM | POA: Diagnosis not present

## 2023-04-27 DIAGNOSIS — M47816 Spondylosis without myelopathy or radiculopathy, lumbar region: Secondary | ICD-10-CM | POA: Diagnosis not present

## 2023-04-27 DIAGNOSIS — M5431 Sciatica, right side: Secondary | ICD-10-CM | POA: Diagnosis not present

## 2023-04-27 DIAGNOSIS — M5126 Other intervertebral disc displacement, lumbar region: Secondary | ICD-10-CM | POA: Diagnosis not present

## 2023-04-27 DIAGNOSIS — M5136 Other intervertebral disc degeneration, lumbar region: Secondary | ICD-10-CM | POA: Diagnosis not present

## 2023-05-11 DIAGNOSIS — N8110 Cystocele, unspecified: Secondary | ICD-10-CM | POA: Diagnosis not present

## 2023-05-22 DIAGNOSIS — M79643 Pain in unspecified hand: Secondary | ICD-10-CM | POA: Diagnosis not present

## 2023-05-22 DIAGNOSIS — E079 Disorder of thyroid, unspecified: Secondary | ICD-10-CM | POA: Diagnosis not present

## 2023-05-22 DIAGNOSIS — Z1231 Encounter for screening mammogram for malignant neoplasm of breast: Secondary | ICD-10-CM | POA: Diagnosis not present

## 2023-05-22 DIAGNOSIS — M2550 Pain in unspecified joint: Secondary | ICD-10-CM | POA: Diagnosis not present

## 2023-05-22 DIAGNOSIS — Z0000 Encounter for general adult medical examination without abnormal findings: Secondary | ICD-10-CM | POA: Diagnosis not present

## 2023-05-22 DIAGNOSIS — E785 Hyperlipidemia, unspecified: Secondary | ICD-10-CM | POA: Diagnosis not present

## 2023-05-22 DIAGNOSIS — F419 Anxiety disorder, unspecified: Secondary | ICD-10-CM | POA: Diagnosis not present

## 2023-05-22 DIAGNOSIS — Z79899 Other long term (current) drug therapy: Secondary | ICD-10-CM | POA: Diagnosis not present

## 2023-05-23 ENCOUNTER — Other Ambulatory Visit: Payer: Self-pay | Admitting: Internal Medicine

## 2023-05-23 DIAGNOSIS — Z1231 Encounter for screening mammogram for malignant neoplasm of breast: Secondary | ICD-10-CM

## 2023-06-01 DIAGNOSIS — M1812 Unilateral primary osteoarthritis of first carpometacarpal joint, left hand: Secondary | ICD-10-CM | POA: Diagnosis not present

## 2023-06-09 DIAGNOSIS — K047 Periapical abscess without sinus: Secondary | ICD-10-CM | POA: Diagnosis not present

## 2023-06-25 DIAGNOSIS — N8110 Cystocele, unspecified: Secondary | ICD-10-CM | POA: Diagnosis not present

## 2023-06-25 DIAGNOSIS — Z4689 Encounter for fitting and adjustment of other specified devices: Secondary | ICD-10-CM | POA: Diagnosis not present

## 2023-07-13 DIAGNOSIS — D2271 Melanocytic nevi of right lower limb, including hip: Secondary | ICD-10-CM | POA: Diagnosis not present

## 2023-07-13 DIAGNOSIS — D2262 Melanocytic nevi of left upper limb, including shoulder: Secondary | ICD-10-CM | POA: Diagnosis not present

## 2023-07-13 DIAGNOSIS — L821 Other seborrheic keratosis: Secondary | ICD-10-CM | POA: Diagnosis not present

## 2023-07-13 DIAGNOSIS — L814 Other melanin hyperpigmentation: Secondary | ICD-10-CM | POA: Diagnosis not present

## 2023-07-13 DIAGNOSIS — D2272 Melanocytic nevi of left lower limb, including hip: Secondary | ICD-10-CM | POA: Diagnosis not present

## 2023-07-13 DIAGNOSIS — D2261 Melanocytic nevi of right upper limb, including shoulder: Secondary | ICD-10-CM | POA: Diagnosis not present

## 2023-07-13 DIAGNOSIS — D225 Melanocytic nevi of trunk: Secondary | ICD-10-CM | POA: Diagnosis not present

## 2023-07-27 ENCOUNTER — Ambulatory Visit
Admission: RE | Admit: 2023-07-27 | Discharge: 2023-07-27 | Disposition: A | Discharge status: Home / Self Care | Payer: Medicare HMO | Source: Ambulatory Visit | Attending: Internal Medicine | Admitting: Internal Medicine

## 2023-07-27 DIAGNOSIS — Z1231 Encounter for screening mammogram for malignant neoplasm of breast: Secondary | ICD-10-CM | POA: Insufficient documentation

## 2023-08-08 DIAGNOSIS — H43812 Vitreous degeneration, left eye: Secondary | ICD-10-CM | POA: Diagnosis not present

## 2023-08-08 DIAGNOSIS — H2513 Age-related nuclear cataract, bilateral: Secondary | ICD-10-CM | POA: Diagnosis not present

## 2023-08-08 DIAGNOSIS — Z83518 Family history of other specified eye disorder: Secondary | ICD-10-CM | POA: Diagnosis not present

## 2023-08-08 DIAGNOSIS — Z0100 Encounter for examination of eyes and vision without abnormal findings: Secondary | ICD-10-CM | POA: Diagnosis not present

## 2023-08-08 DIAGNOSIS — H04123 Dry eye syndrome of bilateral lacrimal glands: Secondary | ICD-10-CM | POA: Diagnosis not present

## 2023-08-16 DIAGNOSIS — Z23 Encounter for immunization: Secondary | ICD-10-CM | POA: Diagnosis not present

## 2023-08-21 DIAGNOSIS — N8110 Cystocele, unspecified: Secondary | ICD-10-CM | POA: Diagnosis not present

## 2023-08-21 DIAGNOSIS — Z4689 Encounter for fitting and adjustment of other specified devices: Secondary | ICD-10-CM | POA: Diagnosis not present

## 2023-08-28 DIAGNOSIS — R3915 Urgency of urination: Secondary | ICD-10-CM | POA: Diagnosis not present

## 2023-08-29 DIAGNOSIS — N8110 Cystocele, unspecified: Secondary | ICD-10-CM | POA: Diagnosis not present

## 2023-08-29 DIAGNOSIS — R3915 Urgency of urination: Secondary | ICD-10-CM | POA: Diagnosis not present

## 2023-11-14 DIAGNOSIS — Z96651 Presence of right artificial knee joint: Secondary | ICD-10-CM | POA: Diagnosis not present

## 2023-11-20 DIAGNOSIS — Z79899 Other long term (current) drug therapy: Secondary | ICD-10-CM | POA: Diagnosis not present

## 2023-11-20 DIAGNOSIS — E039 Hypothyroidism, unspecified: Secondary | ICD-10-CM | POA: Diagnosis not present

## 2023-11-20 DIAGNOSIS — E782 Mixed hyperlipidemia: Secondary | ICD-10-CM | POA: Diagnosis not present

## 2023-11-20 DIAGNOSIS — Z0000 Encounter for general adult medical examination without abnormal findings: Secondary | ICD-10-CM | POA: Diagnosis not present

## 2023-11-20 DIAGNOSIS — F411 Generalized anxiety disorder: Secondary | ICD-10-CM | POA: Diagnosis not present

## 2023-11-20 DIAGNOSIS — R030 Elevated blood-pressure reading, without diagnosis of hypertension: Secondary | ICD-10-CM | POA: Diagnosis not present

## 2023-11-20 DIAGNOSIS — M25511 Pain in right shoulder: Secondary | ICD-10-CM | POA: Diagnosis not present

## 2023-11-22 DIAGNOSIS — M7551 Bursitis of right shoulder: Secondary | ICD-10-CM | POA: Diagnosis not present

## 2023-11-22 DIAGNOSIS — M19011 Primary osteoarthritis, right shoulder: Secondary | ICD-10-CM | POA: Diagnosis not present

## 2023-11-22 DIAGNOSIS — M7541 Impingement syndrome of right shoulder: Secondary | ICD-10-CM | POA: Diagnosis not present

## 2023-11-22 DIAGNOSIS — G8929 Other chronic pain: Secondary | ICD-10-CM | POA: Diagnosis not present

## 2023-11-22 DIAGNOSIS — M67911 Unspecified disorder of synovium and tendon, right shoulder: Secondary | ICD-10-CM | POA: Diagnosis not present

## 2023-11-22 DIAGNOSIS — M778 Other enthesopathies, not elsewhere classified: Secondary | ICD-10-CM | POA: Diagnosis not present

## 2023-11-27 DIAGNOSIS — Z79899 Other long term (current) drug therapy: Secondary | ICD-10-CM | POA: Diagnosis not present

## 2023-11-27 DIAGNOSIS — E039 Hypothyroidism, unspecified: Secondary | ICD-10-CM | POA: Diagnosis not present

## 2023-11-27 DIAGNOSIS — E782 Mixed hyperlipidemia: Secondary | ICD-10-CM | POA: Diagnosis not present

## 2023-12-18 DIAGNOSIS — M25511 Pain in right shoulder: Secondary | ICD-10-CM | POA: Diagnosis not present

## 2023-12-21 DIAGNOSIS — M25511 Pain in right shoulder: Secondary | ICD-10-CM | POA: Diagnosis not present

## 2023-12-25 DIAGNOSIS — M25511 Pain in right shoulder: Secondary | ICD-10-CM | POA: Diagnosis not present

## 2024-01-01 DIAGNOSIS — M25511 Pain in right shoulder: Secondary | ICD-10-CM | POA: Diagnosis not present

## 2024-01-04 DIAGNOSIS — M25511 Pain in right shoulder: Secondary | ICD-10-CM | POA: Diagnosis not present

## 2024-01-08 DIAGNOSIS — M25511 Pain in right shoulder: Secondary | ICD-10-CM | POA: Diagnosis not present

## 2024-01-11 DIAGNOSIS — M25511 Pain in right shoulder: Secondary | ICD-10-CM | POA: Diagnosis not present

## 2024-01-15 DIAGNOSIS — M25511 Pain in right shoulder: Secondary | ICD-10-CM | POA: Diagnosis not present

## 2024-01-18 DIAGNOSIS — M25511 Pain in right shoulder: Secondary | ICD-10-CM | POA: Diagnosis not present

## 2024-01-22 DIAGNOSIS — M25511 Pain in right shoulder: Secondary | ICD-10-CM | POA: Diagnosis not present

## 2024-01-22 DIAGNOSIS — R946 Abnormal results of thyroid function studies: Secondary | ICD-10-CM | POA: Diagnosis not present

## 2024-02-01 DIAGNOSIS — M25511 Pain in right shoulder: Secondary | ICD-10-CM | POA: Diagnosis not present

## 2024-02-05 DIAGNOSIS — M25511 Pain in right shoulder: Secondary | ICD-10-CM | POA: Diagnosis not present

## 2024-02-12 DIAGNOSIS — M25511 Pain in right shoulder: Secondary | ICD-10-CM | POA: Diagnosis not present

## 2024-02-26 DIAGNOSIS — M25511 Pain in right shoulder: Secondary | ICD-10-CM | POA: Diagnosis not present

## 2024-03-04 DIAGNOSIS — M25511 Pain in right shoulder: Secondary | ICD-10-CM | POA: Diagnosis not present

## 2024-05-20 ENCOUNTER — Other Ambulatory Visit: Payer: Self-pay | Admitting: Internal Medicine

## 2024-05-20 DIAGNOSIS — Z1231 Encounter for screening mammogram for malignant neoplasm of breast: Secondary | ICD-10-CM

## 2024-05-20 DIAGNOSIS — F411 Generalized anxiety disorder: Secondary | ICD-10-CM | POA: Diagnosis not present

## 2024-05-20 DIAGNOSIS — R030 Elevated blood-pressure reading, without diagnosis of hypertension: Secondary | ICD-10-CM | POA: Diagnosis not present

## 2024-05-20 DIAGNOSIS — Z131 Encounter for screening for diabetes mellitus: Secondary | ICD-10-CM | POA: Diagnosis not present

## 2024-05-20 DIAGNOSIS — E7800 Pure hypercholesterolemia, unspecified: Secondary | ICD-10-CM | POA: Diagnosis not present

## 2024-05-20 DIAGNOSIS — E039 Hypothyroidism, unspecified: Secondary | ICD-10-CM | POA: Diagnosis not present

## 2024-05-20 DIAGNOSIS — Z79899 Other long term (current) drug therapy: Secondary | ICD-10-CM | POA: Diagnosis not present

## 2024-05-28 DIAGNOSIS — Z131 Encounter for screening for diabetes mellitus: Secondary | ICD-10-CM | POA: Diagnosis not present

## 2024-05-28 DIAGNOSIS — E7800 Pure hypercholesterolemia, unspecified: Secondary | ICD-10-CM | POA: Diagnosis not present

## 2024-05-28 DIAGNOSIS — R030 Elevated blood-pressure reading, without diagnosis of hypertension: Secondary | ICD-10-CM | POA: Diagnosis not present

## 2024-05-28 DIAGNOSIS — Z79899 Other long term (current) drug therapy: Secondary | ICD-10-CM | POA: Diagnosis not present

## 2024-05-28 DIAGNOSIS — E039 Hypothyroidism, unspecified: Secondary | ICD-10-CM | POA: Diagnosis not present

## 2024-08-13 DIAGNOSIS — H2513 Age-related nuclear cataract, bilateral: Secondary | ICD-10-CM | POA: Diagnosis not present

## 2024-08-13 DIAGNOSIS — H04123 Dry eye syndrome of bilateral lacrimal glands: Secondary | ICD-10-CM | POA: Diagnosis not present

## 2024-08-13 DIAGNOSIS — Z83518 Family history of other specified eye disorder: Secondary | ICD-10-CM | POA: Diagnosis not present

## 2024-08-21 DIAGNOSIS — Z23 Encounter for immunization: Secondary | ICD-10-CM | POA: Diagnosis not present

## 2024-10-08 DIAGNOSIS — Q6671 Congenital pes cavus, right foot: Secondary | ICD-10-CM | POA: Diagnosis not present

## 2024-10-08 DIAGNOSIS — M79671 Pain in right foot: Secondary | ICD-10-CM | POA: Diagnosis not present

## 2024-10-08 DIAGNOSIS — M7671 Peroneal tendinitis, right leg: Secondary | ICD-10-CM | POA: Diagnosis not present
